# Patient Record
Sex: Male | Born: 1945 | Race: Black or African American | Hispanic: No | Marital: Married | Smoking: Former smoker
Health system: Southern US, Community
[De-identification: ages and names within clinical notes are randomized; demographics above are authoritative.]

## PROBLEM LIST (undated history)

## (undated) DIAGNOSIS — E039 Hypothyroidism, unspecified: Secondary | ICD-10-CM

## (undated) DIAGNOSIS — I10 Essential (primary) hypertension: Secondary | ICD-10-CM

## (undated) DIAGNOSIS — R42 Dizziness and giddiness: Secondary | ICD-10-CM

## (undated) HISTORY — PX: CHOLECYSTECTOMY: SHX55

---

## 2006-01-15 ENCOUNTER — Emergency Department: Payer: Self-pay | Admitting: Emergency Medicine

## 2006-01-17 ENCOUNTER — Ambulatory Visit: Payer: Self-pay | Admitting: Orthopedic Surgery

## 2006-01-26 ENCOUNTER — Emergency Department: Payer: Self-pay | Admitting: Unknown Physician Specialty

## 2006-01-31 ENCOUNTER — Ambulatory Visit: Payer: Self-pay | Admitting: Internal Medicine

## 2010-09-11 ENCOUNTER — Emergency Department: Payer: Self-pay | Admitting: Emergency Medicine

## 2011-11-15 ENCOUNTER — Emergency Department: Payer: Self-pay | Admitting: Emergency Medicine

## 2012-07-07 DIAGNOSIS — B351 Tinea unguium: Secondary | ICD-10-CM | POA: Diagnosis not present

## 2012-07-07 DIAGNOSIS — R300 Dysuria: Secondary | ICD-10-CM | POA: Diagnosis not present

## 2012-07-07 DIAGNOSIS — I10 Essential (primary) hypertension: Secondary | ICD-10-CM | POA: Diagnosis not present

## 2012-07-07 DIAGNOSIS — Z0000 Encounter for general adult medical examination without abnormal findings: Secondary | ICD-10-CM | POA: Diagnosis not present

## 2012-07-07 DIAGNOSIS — Z125 Encounter for screening for malignant neoplasm of prostate: Secondary | ICD-10-CM | POA: Diagnosis not present

## 2012-10-24 ENCOUNTER — Inpatient Hospital Stay: Payer: Self-pay | Admitting: Internal Medicine

## 2012-10-24 DIAGNOSIS — I6350 Cerebral infarction due to unspecified occlusion or stenosis of unspecified cerebral artery: Secondary | ICD-10-CM | POA: Diagnosis not present

## 2012-10-24 DIAGNOSIS — I10 Essential (primary) hypertension: Secondary | ICD-10-CM | POA: Diagnosis not present

## 2012-10-24 DIAGNOSIS — Z87891 Personal history of nicotine dependence: Secondary | ICD-10-CM | POA: Diagnosis not present

## 2012-10-24 DIAGNOSIS — R471 Dysarthria and anarthria: Secondary | ICD-10-CM | POA: Diagnosis present

## 2012-10-24 DIAGNOSIS — G459 Transient cerebral ischemic attack, unspecified: Secondary | ICD-10-CM | POA: Diagnosis not present

## 2012-10-24 DIAGNOSIS — R42 Dizziness and giddiness: Secondary | ICD-10-CM | POA: Diagnosis present

## 2012-10-24 DIAGNOSIS — H1030 Unspecified acute conjunctivitis, unspecified eye: Secondary | ICD-10-CM | POA: Diagnosis not present

## 2012-10-24 DIAGNOSIS — G510 Bell's palsy: Secondary | ICD-10-CM | POA: Diagnosis not present

## 2012-10-24 DIAGNOSIS — Z9089 Acquired absence of other organs: Secondary | ICD-10-CM | POA: Diagnosis not present

## 2012-10-24 DIAGNOSIS — R4789 Other speech disturbances: Secondary | ICD-10-CM | POA: Diagnosis present

## 2012-10-24 DIAGNOSIS — I498 Other specified cardiac arrhythmias: Secondary | ICD-10-CM | POA: Diagnosis not present

## 2012-10-24 DIAGNOSIS — R1310 Dysphagia, unspecified: Secondary | ICD-10-CM | POA: Diagnosis present

## 2012-10-24 DIAGNOSIS — D219 Benign neoplasm of connective and other soft tissue, unspecified: Secondary | ICD-10-CM | POA: Diagnosis not present

## 2012-10-24 DIAGNOSIS — R51 Headache: Secondary | ICD-10-CM | POA: Diagnosis not present

## 2012-10-24 LAB — COMPREHENSIVE METABOLIC PANEL
Albumin: 3.6 g/dL (ref 3.4–5.0)
BUN: 13 mg/dL (ref 7–18)
Creatinine: 0.99 mg/dL (ref 0.60–1.30)
EGFR (African American): >60
EGFR (Non-African Amer.): >60
Potassium: 3.9 mmol/L (ref 3.5–5.1)
SGOT(AST): 28 U/L (ref 15–37)
SGPT (ALT): 24 U/L (ref 12–78)
Sodium: 139 mmol/L (ref 136–145)
Total Protein: 7.8 g/dL (ref 6.4–8.2)

## 2012-10-24 LAB — CBC
MCV: 86 fL (ref 80–100)
RBC: 4.88 10*6/uL (ref 4.40–5.90)

## 2012-10-25 LAB — BASIC METABOLIC PANEL
Anion Gap: 4 — ABNORMAL LOW (ref 7–16)
BUN: 11 mg/dL (ref 7–18)
Calcium, Total: 8.8 mg/dL (ref 8.5–10.1)
Chloride: 107 mmol/L (ref 98–107)
Co2: 28 mmol/L (ref 21–32)
Creatinine: 0.92 mg/dL (ref 0.60–1.30)
EGFR (African American): >60
EGFR (Non-African Amer.): >60
Glucose: 125 mg/dL — ABNORMAL HIGH (ref 65–99)
Osmolality: 278 (ref 275–301)
Potassium: 3.9 mmol/L (ref 3.5–5.1)
Sodium: 139 mmol/L (ref 136–145)

## 2012-10-25 LAB — LIPID PANEL
Cholesterol: 129 mg/dL (ref 0–200)
HDL Cholesterol: 50 mg/dL (ref 40–60)
Ldl Cholesterol, Calc: 71 mg/dL (ref 0–100)
VLDL Cholesterol, Calc: 8 mg/dL (ref 5–40)

## 2012-10-25 LAB — CBC WITH DIFFERENTIAL/PLATELET
Basophil #: 0.1 10*3/uL (ref 0.0–0.1)
Eosinophil #: 0.1 10*3/uL (ref 0.0–0.7)
Eosinophil %: 4.4 %
HCT: 38.8 % — ABNORMAL LOW (ref 40.0–52.0)
HGB: 13.5 g/dL (ref 13.0–18.0)
Lymphocyte #: 1.2 10*3/uL (ref 1.0–3.6)
Lymphocyte %: 37.6 %
MCH: 29.4 pg (ref 26.0–34.0)
MCV: 84 fL (ref 80–100)
Monocyte #: 0.4 x10 3/mm (ref 0.2–1.0)
Neutrophil %: 43.4 %
Platelet: 233 10*3/uL (ref 150–440)
RBC: 4.6 10*6/uL (ref 4.40–5.90)
RDW: 14.1 % (ref 11.5–14.5)
WBC: 3.2 10*3/uL — ABNORMAL LOW (ref 3.8–10.6)

## 2012-10-25 LAB — URINALYSIS, COMPLETE
Bacteria: NONE SEEN
Bilirubin,UR: NEGATIVE
Blood: NEGATIVE
Glucose,UR: NEGATIVE mg/dL (ref 0–75)
Ketone: NEGATIVE
Leukocyte Esterase: NEGATIVE
Protein: NEGATIVE
RBC,UR: 1 /HPF (ref 0–5)
Specific Gravity: 1.02 (ref 1.003–1.030)
Squamous Epithelial: <1

## 2012-10-25 LAB — MAGNESIUM: Magnesium: 1.5 mg/dL — ABNORMAL LOW

## 2012-10-26 LAB — APTT: Activated PTT: 27.5 secs (ref 23.6–35.9)

## 2012-10-26 LAB — SEDIMENTATION RATE: Erythrocyte Sed Rate: 6 mm/hr (ref 0–20)

## 2012-10-27 LAB — MAGNESIUM: Magnesium: 1.6 mg/dL — ABNORMAL LOW

## 2012-11-02 DIAGNOSIS — H169 Unspecified keratitis: Secondary | ICD-10-CM | POA: Diagnosis not present

## 2012-11-19 DIAGNOSIS — R9409 Abnormal results of other function studies of central nervous system: Secondary | ICD-10-CM | POA: Diagnosis not present

## 2012-11-19 DIAGNOSIS — G510 Bell's palsy: Secondary | ICD-10-CM | POA: Diagnosis not present

## 2012-11-19 DIAGNOSIS — I10 Essential (primary) hypertension: Secondary | ICD-10-CM | POA: Diagnosis not present

## 2012-12-21 DIAGNOSIS — H40009 Preglaucoma, unspecified, unspecified eye: Secondary | ICD-10-CM | POA: Diagnosis not present

## 2013-08-16 DIAGNOSIS — I10 Essential (primary) hypertension: Secondary | ICD-10-CM | POA: Diagnosis not present

## 2013-08-16 DIAGNOSIS — G510 Bell's palsy: Secondary | ICD-10-CM | POA: Diagnosis not present

## 2013-08-16 DIAGNOSIS — E782 Mixed hyperlipidemia: Secondary | ICD-10-CM | POA: Diagnosis not present

## 2013-08-16 DIAGNOSIS — R300 Dysuria: Secondary | ICD-10-CM | POA: Diagnosis not present

## 2013-08-16 DIAGNOSIS — Z0000 Encounter for general adult medical examination without abnormal findings: Secondary | ICD-10-CM | POA: Diagnosis not present

## 2013-08-16 DIAGNOSIS — R3911 Hesitancy of micturition: Secondary | ICD-10-CM | POA: Diagnosis not present

## 2013-08-16 DIAGNOSIS — Z125 Encounter for screening for malignant neoplasm of prostate: Secondary | ICD-10-CM | POA: Diagnosis not present

## 2013-11-09 ENCOUNTER — Ambulatory Visit: Payer: Self-pay | Admitting: Gastroenterology

## 2013-11-09 DIAGNOSIS — E079 Disorder of thyroid, unspecified: Secondary | ICD-10-CM | POA: Diagnosis not present

## 2013-11-09 DIAGNOSIS — Z1211 Encounter for screening for malignant neoplasm of colon: Secondary | ICD-10-CM | POA: Diagnosis not present

## 2013-11-09 DIAGNOSIS — D123 Benign neoplasm of transverse colon: Secondary | ICD-10-CM | POA: Diagnosis not present

## 2013-11-09 DIAGNOSIS — I10 Essential (primary) hypertension: Secondary | ICD-10-CM | POA: Diagnosis not present

## 2013-11-09 DIAGNOSIS — Z79899 Other long term (current) drug therapy: Secondary | ICD-10-CM | POA: Diagnosis not present

## 2013-12-23 DIAGNOSIS — I10 Essential (primary) hypertension: Secondary | ICD-10-CM | POA: Diagnosis not present

## 2013-12-23 DIAGNOSIS — R7301 Impaired fasting glucose: Secondary | ICD-10-CM | POA: Diagnosis not present

## 2013-12-23 DIAGNOSIS — R5383 Other fatigue: Secondary | ICD-10-CM | POA: Diagnosis not present

## 2013-12-23 DIAGNOSIS — E039 Hypothyroidism, unspecified: Secondary | ICD-10-CM | POA: Diagnosis not present

## 2013-12-23 DIAGNOSIS — E782 Mixed hyperlipidemia: Secondary | ICD-10-CM | POA: Diagnosis not present

## 2014-01-11 DIAGNOSIS — E039 Hypothyroidism, unspecified: Secondary | ICD-10-CM | POA: Diagnosis not present

## 2014-01-11 DIAGNOSIS — R5383 Other fatigue: Secondary | ICD-10-CM | POA: Diagnosis not present

## 2014-01-13 DIAGNOSIS — R946 Abnormal results of thyroid function studies: Secondary | ICD-10-CM | POA: Diagnosis not present

## 2014-01-27 DIAGNOSIS — I10 Essential (primary) hypertension: Secondary | ICD-10-CM | POA: Diagnosis not present

## 2014-01-27 DIAGNOSIS — E039 Hypothyroidism, unspecified: Secondary | ICD-10-CM | POA: Diagnosis not present

## 2014-01-27 DIAGNOSIS — E042 Nontoxic multinodular goiter: Secondary | ICD-10-CM | POA: Diagnosis not present

## 2014-02-24 ENCOUNTER — Ambulatory Visit: Payer: Self-pay | Admitting: Physician Assistant

## 2014-02-24 DIAGNOSIS — E041 Nontoxic single thyroid nodule: Secondary | ICD-10-CM | POA: Diagnosis not present

## 2014-02-25 DIAGNOSIS — E041 Nontoxic single thyroid nodule: Secondary | ICD-10-CM | POA: Diagnosis not present

## 2014-02-25 DIAGNOSIS — E042 Nontoxic multinodular goiter: Secondary | ICD-10-CM | POA: Diagnosis not present

## 2014-04-13 DIAGNOSIS — R946 Abnormal results of thyroid function studies: Secondary | ICD-10-CM | POA: Diagnosis not present

## 2014-04-13 DIAGNOSIS — E042 Nontoxic multinodular goiter: Secondary | ICD-10-CM | POA: Diagnosis not present

## 2014-04-13 DIAGNOSIS — I10 Essential (primary) hypertension: Secondary | ICD-10-CM | POA: Diagnosis not present

## 2014-04-29 NOTE — Discharge Summary (Signed)
PATIENT NAME:  REEDY, BIERNAT MR#:  [id number] DATE OF BIRTH:  06/24/1945  DATE OF ADMISSION:  10/24/2012 DATE OF DISCHARGE:  10/27/2012  DISCHARGE DIAGNOSIS:  1.  Left facial weakness, Bell's palsy, cerebrovascular accident ruled out. 2.  Hypertension on presentation, which is resolved. 3.  Vertigo on admission, which is resolved.   CONDITION ON DISCHARGE: Stable.   CODE STATUS: Full code.   MEDICATIONS ON DISCHARGE:  1.  Acetaminophen, butalbital and caffeine every six hours for three days as needed.  2.  Valacyclovir 1 gram oral every 12 hours for nine days.  3.  Prednisone 10 mg oral tablet, start at 60 and taper x 10 mg until complete.  4.  Ciprofloxacin ophthalmic solution two drops to each eye every day for four hours.   DIET ON DISCHARGE: Low sodium diet; consistency regular.  ACTIVTY: As  tolerated.  TIMEFRAME TO FOLLOW-UP:  In 1 to 2 weeks in [facility] neurology clinic.  HISTORY OF PRESENT ILLNESS:  See history and physical done on 10/18 by Dr. Nicholes Mango.  This 69 year old African American male has hypertension, presented to ER with chief complaint of headache and left-sided facial droop, slurred speech, difficulty swallowing since yesterday. He was totally unable shut his left eye, and it was constantly watering from that eye.  Speech was slurry and having difficulty in expressing himself. He denied any blurry vision, dizziness or loss of consciousness, and no similar episode in the past.   HOSPITAL COURSE AND STAY: For left-sided facial weakness we did workup to rule out cerebrovascular accident.  We did MRI, which was negative for any blockages. Carotid Doppler was also negative, without any blockages. His headache continued up after ruling out cerebrovascular accident, so we called a neurologist to come and see him. He thought most likely it is Bell's palsy, due to weakness on one side of the face and severe headache, and he suggested to start on steroid and keep him for watching  one more day, but the next day morning he requested that he wanted to go home. As he was hemodynamically stable, we decided to discharge him.   Dr. Tamala Julian, the neurologist, wanted to do further work-up about his Bell's palsy, including lumbar puncture also, but the patient did not want to have any further work-up, had already started feeling better. Dr. Thompson Caul plan was do lumbar puncture, but the patient denies for that, and he chose to go home, as symptoms were improving.   Dysphagia. He passed swallowing evaluation and advised diet on discharge.  Hypertension. Blood pressure was high initially on presentation, but that might be due to headache. It was then within normal limits.   CONSULT IN THE HOSPITAL: Dr. Valora Corporal for neurology.  IMPORTANT LABORATORY DATA:  WBC 4.4, hemoglobin 14.3. Creatinine 0.99, sodium 139, potassium 3.9. Urinalysis was grossly negative. Echocardiogram of the heart showed left ventricular ejection fraction 70%, elevated left atrial and left ventricular end diastolic pressure, restrictive pattern of LV diastolic filling. MRI of the brain without contrast showed no acute intracranial pathology.  TOTAL TIME SPENT ON THIS DISCHARGE: 40 minutes.   ____________________________ Ceasar Lund Anselm Jungling, MD vgv:cg D: 10/31/2012 23:26:00 ET T: 11/01/2012 00:59:09 ET JOB#: [id number]  cc: Doneta Public. Melrose Nakayama, MD Ceasar Lund Anselm Jungling, MD, <Dictator> DR. JACK WATERS, __________ CLINIC, NEUROLOGY OFFICE   Vaughan Basta MD ELECTRONICALLY SIGNED 11/04/2012 13:58

## 2014-04-29 NOTE — Consult Note (Signed)
Referring Physician:  Nicholes Price :   Primary Care Physician:  Jeremiah Price : [place], [address] [place], [place] [address], [place] [phone]  Reason for Consult: Admit Date: 24-Oct-2012  Chief Complaint: L facial droop  Reason for Consult: L facial droop   History of Present Illness: History of Present Illness:   69 yo RHD M presents to [facility] secondary to new onset L facial droop and slurred speech.  He denies any other complaints besides a mild headache which on the top of his head and new for him.  [name], [name], no illicits, retired Scientist, [name], resting  comfortable, [name], sclera nonicteric, oropharynx clear  Neck: supple, [name], [name], no wheezing, good movement  Cardiac: RRR, no murmurs, no edema, [name], [name], normal language, follows complex commands, [name], [name], [name], [name], tongue midline, [name], tone, [name], [name], [name], temperature, [name], [name], [name], [name], Serum 7.8  Albumin, [name], Serum 129  Triglycerides, Serum 38  HDL (INHOUSE) 50  VLDL Cholesterol Calculated 8  LDL Cholesterol Calculated 71 (Result(s) reported on 25 Oct 2012 at 07:34AM.)  Glucose, Serum  125  BUN 11  Creatinine (comp) 0.92  Sodium, Serum 139  Potassium, Serum 3.9  Chloride, [name], Serum 28  Calcium (Total), [name], pregnant women, [name], Serum  1.5 (1.8-2.4 THERAPEUTIC RANGE: 4-7 mg/dL TOXIC: > 10 mg/dL   -----------------------)  Cardiac:  18-Oct-14 22:00   Troponin I < 0.02 (0.00-0.05 0.05 ng/mL or less: NEGATIVE  Repeat testing in 3-6 hrs  if clinically indicated. >0.05 ng/mL: POTENTIAL  MYOCARDIAL INJURY. Repeat  testing in 3-6 hrs if  clinically indicated. NOTE: An increase or decrease  of 30% or more on serial  testing suggests a  clinically important change)  Routine UA:  19-Oct-14 01:03   Color (UA) Yellow  Clarity (UA) Clear  Glucose (UA) Negative  Bilirubin (UA) Negative  Ketones (UA) Negative  Specific Gravity (UA) 1.020  Blood (UA) Negative  pH (UA) 6.0  Protein (UA) Negative  Nitrite (UA) Negative  Leukocyte Esterase (UA) Negative (Result(s) reported on 25 Oct 2012 at 01:36AM.)  RBC (UA) 1 /HPF  WBC (UA) <1 /HPF  Bacteria (UA) NONE SEEN  Epithelial Cells (UA) <1 /HPF  Mucous (UA) PRESENT (Result(s) reported on 25 Oct 2012 at 01:36AM.)  Routine Hem:  19-Oct-14 06:36   WBC (CBC)  3.2  RBC (CBC) 4.60  Hemoglobin (CBC) 13.5  Hematocrit (CBC)  38.8  Platelet Count (CBC) 233  MCV 84  MCH 29.4  MCHC 34.8  RDW 14.1  Neutrophil % 43.4  Lymphocyte % 37.6  Monocyte % 12.5  Eosinophil % 4.4  Basophil % 2.1  Neutrophil # 1.4  Lymphocyte # 1.2  Monocyte # 0.4  Eosinophil # 0.1  Basophil # 0.1 (Result(s) reported on 25 Oct 2012 at 07:06AM.)   Radiology Results: [place]    19-Oct-14 15:42, [place] Doppler Bilateral  [place] Doppler Bilateral   REASON FOR EXAM:    CVA  COMMENTS:       PROCEDURE: [place] - [place]OT[place] DOPPLER BILATERAL  - Oct 25 2012  3:42PM     RESULT: Carotid Doppler interrogation demonstrates no significant   atherosclerotic plaque or stenosis. No ulceration is seen. The color  Doppler and spectral Doppler appearance is normal throughout the carotids   segments evaluated. The vertebral arteries appear to be unremarkable.   There is antegrade flow in both vertebrals without flow reversal. The   Doppler waveforms are unremarkable. The peak  systolic velocities are   within normal limits bilaterally. The internal to common carotid peak   systolic velocity ratio of is 0.35 on the right and 0.68 on the left.    IMPRESSION:   1. No significant atherosclerotic disease. No evidence of hemodynamically   significant stenosis.    Dictation Site: 6        Verified By: GSundra Aland M.D., MD  MRI:    20-Oct-14 09:50, MRI Brain Without Contrast  MRI Brain Without Contrast   REASON FOR EXAM:    CVA  COMMENTS:       PROCEDURE: MR  - MR BRAIN WO CONTRAST  - Oct 26 2012  9:50AM     RESULT: History: CVA    Technique: Multiplanar, multisequence MRI of the brain was obtained   without IV contrast.  Comparison:  None    Findings:     There is no acute infarct. There is no hemorrhage. There is no pathologic     extra-axial fluid collection. There is generalized cerebral atrophy.   There is no hydrocephalus. The ventricles are normal for age. The basal   cisterns are patent. There are no abnormal extra-axial fluid collections.     The visualized paranasal sinuses and mastoid sinuses are clear. The skull   base and calvarium demonstrate normal signal. The major intracranial flow   voids, including dural venous sinuses appear patent.    IMPRESSION:     No acute intracranial pathology.    Dictation Site: 1      Verified By: Jennette Banker, M.D., MD  CT:    18-Oct-14 22:18, CT Head Without Contrast  CT Head Without Contrast   REASON FOR EXAM:    CVA  COMMENTS:   May transport without cardiac monitor    PROCEDURE: CT  - CT HEAD WITHOUT CONTRAST  - Oct 24 2012 10:18PM     RESULT: Comparison is made images dated 09/11/2010. There is prominence of   the ventricles and sulci within normal limits for the patient's age.   There is no evolving infarct , hemorrhage, mass or mass effect. The   paranasal sinuses and mastoid air cells demonstrate normal appearing   aeration. The calvarium is intact.    IMPRESSION:   Atrophy. No acute intracranial abnormality. No interval   change.    Dictation Site: 6    Verified By: Sundra Aland, M.D., MD   Radiology Impression: Radiology Impression: MRI of brain personally reviewed by me and completely normal   Impression/Recommendations: Recommendations:   prior notes reviewed by me reviewed by me    Probable Bells palsy-  there are a few things on the differential to r/o since he has a headache which include autoimmune and infectious cranial nerve palsies.  If it is Bells palsy, pt will still likely have some deficits do the severity of the attack. Headache-  unknown etiology and quite unusual,  needs more work up MRI of brain w/ contrast and IAC's LP tomorrow after coagulation returns check lyme, B12, ESR, CRP, ANA start Valtrex 1gm BID now will start Prednisone tomorrow if all w/u is neg will follow  Electronic Signatures: Jeremiah Price (MD)  (Signed 20-Oct-14 14:50)  Authored: REFERRING PHYSICIAN, Primary Care Physician, Consult, History of Present Illness, Review of Systems, PAST MEDICAL/SURGICAL HISTORY, HOME MEDICATIONS, ALLERGIES, Social/Family History, NURSING VITAL SIGNS, Physical Exam-, LAB RESULTS, RADIOLOGY RESULTS, Recommendations   Last Updated: 20-Oct-14 14:50 by Jeremiah Price (MD)

## 2014-04-29 NOTE — H&P (Signed)
PATIENT NAME:  RUMI, TARAS MR#:  [id number] DATE OF BIRTH:  1945-10-29  DATE OF ADMISSION:  10/24/2012  PRIMARY CARE PHYSICIAN: Dr. Clayborn Bigness.   REFERRING PHYSICIAN: Dr. Lenise Arena.   CHIEF COMPLAINT: Left facial droop, difficulty swallowing, slurry speech.   HISTORY OF PRESENT ILLNESS: The patient is a 69 year old African American male with past medical history of hypertension. He is presenting to the ER with a chief complaint of headache, left facial droop, slurry speech, difficulty with swallowing since yesterday. The patient was in his usual state of health until yesterday afternoon. He first started having a dull headache associated with left facial droop. The patient was also unable to shut his left eye since this morning. His speech is slurry, and having difficulty in expressing himself. Wife is reporting that he was giving a lot of pauses while speaking and searching for words. The patient is having difficulty with swallowing. Left leg has decreased touch sensation. His mouth is pushed to the right side. No similar complaints in the past. Denies any blurry vision, dizziness or loss of consciousness. Denies any chest pain or shortness of breath. In the ER, CAT scan of the head is negative. Hospitalist team is called to admit the patient. The patient has received aspirin, rectal suppository as he is having difficulty with swallowing.   PAST MEDICAL HISTORY: Hypertension. History of vertigo in the past.  PAST SURGICAL HISTORY: Cholecystectomy.  ALLERGIES: THE PATIENT HAS NO KNOWN DRUG ALLERGIES.   PSYCHOSOCIAL HISTORY: Lives at home with wife. He used to smoke, but quit smoking in the year 1973. He used to drink, and quit drinking one year ago. Denies any illicit drug usage.   FAMILY HISTORY: Mom was healthy. Dad deceased when he was very young.  HOME MEDICATIONS: Norco 1 tablet p.o. q.6h. as needed, hydrochlorothiazide 25 mg once daily.   REVIEW OF SYSTEMS:  CONSTITUTIONAL:  Denies any fever, fatigue. EYES: Denies blurry vision and cataracts.  ENT: Denies epistaxis, discharge.  RESPIRATION: Denies cough, COPD. CARDIOVASCULAR: Denies chest pain or palpitations.  GASTROINTESTINAL: Denies nausea, vomiting, diarrhea, abdominal pain.  GENITOURINARY: No dysuria, hematuria. No prostatitis. Denies hernia.  ENDOCRINE: Denies polyuria, nocturia, thyroid problems.  HEMATOLOGIC AND LYMPHATIC: No anemia, easy bruising, bleeding.  INTEGUMENTARY: No acne, rash, lesions.  MUSCULOSKELETAL: No joint pain in the neck and back. Denies gout.  NEUROLOGIC: Complaining of dysarthria, headache, decreased sensation in the left leg. PSYCHIATRIC:  Denies any ADD, OCD.   PHYSICAL EXAMINATION:  VITAL SIGNS: Temperature 98.2, pulse 60, respirations 16, blood pressure 140/62, pulse oximetry 98%.  GENERAL APPEARANCE: Not in acute distress. Moderately built and nourished.  HEENT: Normocephalic, atraumatic. Pupils are equal, reacting light and accommodation. No scleral icterus. No conjunctival injection. Extraocular movements are intact, but the patient is unable to shut his left eye. Angle of mouth is pushed to the right side. Nasolabial fold is obliterated on the left side. Left frontal folds are obliterated.  Drooling of saliva is noted on the left side of the angle of the mouth.  NECK: Supple. [name]. No thyromegaly. Range of motion is intact.  LUNGS: Clear to auscultation bilaterally. No accessory muscle use and no anterior chest wall tenderness on palpation. No peripheral edema.  CARDIAC: S1, S2 normal. Regular rate and rhythm. [name].  GASTROINTESTINAL: Soft. Bowel sounds are positive in all four quadrants. Nontender, nondistended. No hepatosplenomegaly. No masses felt.  NEUROLOGIC:  Awake, alert, oriented x3. Cranial nerves II through XII are grossly intact. No cerebellar signs. Finger-nose test  is intact. No pronator drift. Decreased touch sensation in the left lower extremity. Motor is  intact in all four extremities with 5/5 strength. Positive dysphagia, positive dysarthria.  EXTREMITIES: [name]. No cyanosis. No clubbing. Dorsalis pedis and posterior tibialis pulses are intact. No CVA tenderness.  SKIN: Warm to touch. Normal turgor. No rashes. No lesions.  MUSCULOSKELETAL: No joint effusion, tenderness, erythema.  PSYCHIATRIC: Normal mood and affect.   DIAGNOSTIC STUDIES:  CAT scan of the head: No acute findings. LFTs are normal. Troponin less than 0.02. CBC normal. BMP is also normal, except anion gap is low at 3 and  glucose is at 102. A 12-lead EKG: Sinus bradycardia at 57 beats per minute, nonspecific ST-T wave changes.    ASSESSMENT AND PLAN: A 69 year old African American male, presenting to the ER with chief complaint of left facial droop, dysphagia, dysarthria, decreased touch sensation in the left lower extremity. Will be admitted with following assessment and plan.   1.  Acute cerebrovascular accident with dysphagia, dysarthria, and other stroke symptoms. Will get stroke work-up, as the patient is persistently having stroke symptoms. We will obtain MRA of the brain, carotid Dopplers and 2-D echocardiogram. Will obtain neuro checks. Will keep him n.p.o. and perform swallowing evaluation. Will provide him IV fluids while the patient is n.p.o.  2.  Hypertension.  Will allow permissive hypertension, as the patient has acute cerebrovascular accident symptoms.  3.  Sinus bradycardia. The patient is asymptomatic.  Will continue close monitoring of the patient. Will be on telemetry.  4.  History of vertigo. The patient denies any vertigo at this time.  5.  Will provide him gastrointestinal and deep vein thrombosis prophylaxis.   He is full code. Wife is the medical power of attorney. Diagnosis and plan of care were discussed in detail with the patient and his wife at bedside. They both verbalized understanding of the plan.   Total time spent is 45 minutes.    ____________________________ Nicholes Mango, MD ag:cg D: 10/25/2012 01:09:17 ET T: 10/25/2012 01:36:41 ET JOB#: [id number]  cc: Nicholes Mango, MD, <Dictator> Nicholes Mango MD ELECTRONICALLY SIGNED 11/06/2012 1:59

## 2014-05-02 LAB — SURGICAL PATHOLOGY

## 2014-08-18 DIAGNOSIS — E042 Nontoxic multinodular goiter: Secondary | ICD-10-CM | POA: Diagnosis not present

## 2014-09-20 DIAGNOSIS — Z0000 Encounter for general adult medical examination without abnormal findings: Secondary | ICD-10-CM | POA: Diagnosis not present

## 2014-09-20 DIAGNOSIS — Z125 Encounter for screening for malignant neoplasm of prostate: Secondary | ICD-10-CM | POA: Diagnosis not present

## 2014-09-20 DIAGNOSIS — Z0001 Encounter for general adult medical examination with abnormal findings: Secondary | ICD-10-CM | POA: Diagnosis not present

## 2014-09-20 DIAGNOSIS — E782 Mixed hyperlipidemia: Secondary | ICD-10-CM | POA: Diagnosis not present

## 2014-09-20 DIAGNOSIS — K4090 Unilateral inguinal hernia, without obstruction or gangrene, not specified as recurrent: Secondary | ICD-10-CM | POA: Diagnosis not present

## 2014-09-20 DIAGNOSIS — E042 Nontoxic multinodular goiter: Secondary | ICD-10-CM | POA: Diagnosis not present

## 2014-09-20 DIAGNOSIS — I10 Essential (primary) hypertension: Secondary | ICD-10-CM | POA: Diagnosis not present

## 2014-12-14 DIAGNOSIS — R42 Dizziness and giddiness: Secondary | ICD-10-CM | POA: Diagnosis not present

## 2015-02-09 DIAGNOSIS — Z0001 Encounter for general adult medical examination with abnormal findings: Secondary | ICD-10-CM | POA: Diagnosis not present

## 2015-02-09 DIAGNOSIS — I10 Essential (primary) hypertension: Secondary | ICD-10-CM | POA: Diagnosis not present

## 2015-02-09 DIAGNOSIS — R7301 Impaired fasting glucose: Secondary | ICD-10-CM | POA: Diagnosis not present

## 2015-02-09 DIAGNOSIS — E782 Mixed hyperlipidemia: Secondary | ICD-10-CM | POA: Diagnosis not present

## 2015-02-09 DIAGNOSIS — R042 Hemoptysis: Secondary | ICD-10-CM | POA: Diagnosis not present

## 2015-04-07 DIAGNOSIS — R7301 Impaired fasting glucose: Secondary | ICD-10-CM | POA: Diagnosis not present

## 2015-04-07 DIAGNOSIS — E042 Nontoxic multinodular goiter: Secondary | ICD-10-CM | POA: Diagnosis not present

## 2015-04-07 DIAGNOSIS — K4090 Unilateral inguinal hernia, without obstruction or gangrene, not specified as recurrent: Secondary | ICD-10-CM | POA: Diagnosis not present

## 2015-04-07 DIAGNOSIS — I10 Essential (primary) hypertension: Secondary | ICD-10-CM | POA: Diagnosis not present

## 2015-09-22 DIAGNOSIS — K4090 Unilateral inguinal hernia, without obstruction or gangrene, not specified as recurrent: Secondary | ICD-10-CM | POA: Diagnosis not present

## 2015-09-22 DIAGNOSIS — Z0001 Encounter for general adult medical examination with abnormal findings: Secondary | ICD-10-CM | POA: Diagnosis not present

## 2015-09-22 DIAGNOSIS — E042 Nontoxic multinodular goiter: Secondary | ICD-10-CM | POA: Diagnosis not present

## 2015-09-22 DIAGNOSIS — I10 Essential (primary) hypertension: Secondary | ICD-10-CM | POA: Diagnosis not present

## 2015-09-22 DIAGNOSIS — R7301 Impaired fasting glucose: Secondary | ICD-10-CM | POA: Diagnosis not present

## 2015-09-22 DIAGNOSIS — Z125 Encounter for screening for malignant neoplasm of prostate: Secondary | ICD-10-CM | POA: Diagnosis not present

## 2016-02-16 ENCOUNTER — Emergency Department: Payer: Medicare Other

## 2016-02-16 ENCOUNTER — Inpatient Hospital Stay
Admission: EM | Admit: 2016-02-16 | Discharge: 2016-02-19 | DRG: 871 | Disposition: A | Discharge status: Home / Self Care | Payer: Medicare Other | Attending: Internal Medicine | Admitting: Internal Medicine

## 2016-02-16 ENCOUNTER — Encounter: Payer: Self-pay | Admitting: Emergency Medicine

## 2016-02-16 DIAGNOSIS — E039 Hypothyroidism, unspecified: Secondary | ICD-10-CM | POA: Diagnosis not present

## 2016-02-16 DIAGNOSIS — R7989 Other specified abnormal findings of blood chemistry: Secondary | ICD-10-CM

## 2016-02-16 DIAGNOSIS — A4151 Sepsis due to Escherichia coli [E. coli]: Secondary | ICD-10-CM | POA: Diagnosis not present

## 2016-02-16 DIAGNOSIS — R509 Fever, unspecified: Secondary | ICD-10-CM

## 2016-02-16 DIAGNOSIS — R945 Abnormal results of liver function studies: Secondary | ICD-10-CM

## 2016-02-16 DIAGNOSIS — K759 Inflammatory liver disease, unspecified: Secondary | ICD-10-CM

## 2016-02-16 DIAGNOSIS — R109 Unspecified abdominal pain: Secondary | ICD-10-CM | POA: Diagnosis not present

## 2016-02-16 DIAGNOSIS — R740 Nonspecific elevation of levels of transaminase and lactic acid dehydrogenase [LDH]: Secondary | ICD-10-CM

## 2016-02-16 DIAGNOSIS — K7200 Acute and subacute hepatic failure without coma: Secondary | ICD-10-CM | POA: Diagnosis present

## 2016-02-16 DIAGNOSIS — R7401 Elevation of levels of liver transaminase levels: Secondary | ICD-10-CM | POA: Diagnosis present

## 2016-02-16 DIAGNOSIS — R9431 Abnormal electrocardiogram [ECG] [EKG]: Secondary | ICD-10-CM | POA: Diagnosis not present

## 2016-02-16 DIAGNOSIS — I10 Essential (primary) hypertension: Secondary | ICD-10-CM | POA: Diagnosis present

## 2016-02-16 DIAGNOSIS — B199 Unspecified viral hepatitis without hepatic coma: Secondary | ICD-10-CM | POA: Diagnosis not present

## 2016-02-16 DIAGNOSIS — R42 Dizziness and giddiness: Secondary | ICD-10-CM | POA: Diagnosis not present

## 2016-02-16 DIAGNOSIS — A419 Sepsis, unspecified organism: Secondary | ICD-10-CM | POA: Diagnosis present

## 2016-02-16 HISTORY — DX: Essential (primary) hypertension: I10

## 2016-02-16 LAB — PROTIME-INR
INR: 1.03
PROTHROMBIN TIME: 13.5 s (ref 11.4–15.2)

## 2016-02-16 LAB — LACTIC ACID, PLASMA
LACTIC ACID, VENOUS: 1.4 mmol/L (ref 0.5–1.9)
LACTIC ACID, VENOUS: 1.3 mmol/L (ref 0.5–1.9)

## 2016-02-16 LAB — COMPREHENSIVE METABOLIC PANEL
ALT: 566 U/L — ABNORMAL HIGH (ref 17–63)
ANION GAP: 8 (ref 5–15)
AST: 911 U/L — ABNORMAL HIGH (ref 15–41)
Albumin: 4.1 g/dL (ref 3.5–5.0)
Alkaline Phosphatase: 237 U/L — ABNORMAL HIGH (ref 38–126)
BUN: 10 mg/dL (ref 6–20)
CHLORIDE: 100 mmol/L — AB (ref 101–111)
CO2: 28 mmol/L (ref 22–32)
Calcium: 9.1 mg/dL (ref 8.9–10.3)
Creatinine, Ser: 0.99 mg/dL (ref 0.61–1.24)
Glucose, Bld: 166 mg/dL — ABNORMAL HIGH (ref 65–99)
Potassium: 3.5 mmol/L (ref 3.5–5.1)
Sodium: 136 mmol/L (ref 135–145)
TOTAL PROTEIN: 8.1 g/dL (ref 6.5–8.1)
Total Bilirubin: 2.2 mg/dL — ABNORMAL HIGH (ref 0.3–1.2)

## 2016-02-16 LAB — LIPASE, BLOOD: Lipase: 61 U/L — ABNORMAL HIGH (ref 11–51)

## 2016-02-16 LAB — URINALYSIS, COMPLETE (UACMP) WITH MICROSCOPIC
Bacteria, UA: NONE SEEN
Bilirubin Urine: NEGATIVE
GLUCOSE, UA: NEGATIVE mg/dL
KETONES UR: NEGATIVE mg/dL
LEUKOCYTES UA: NEGATIVE
Nitrite: NEGATIVE
PH: 7 (ref 5.0–8.0)
Protein, ur: NEGATIVE mg/dL
SPECIFIC GRAVITY, URINE: 1.019 (ref 1.005–1.030)

## 2016-02-16 LAB — INFLUENZA PANEL BY PCR (TYPE A & B)
INFLBPCR: NEGATIVE
Influenza A By PCR: NEGATIVE

## 2016-02-16 LAB — CBC WITH DIFFERENTIAL/PLATELET
BASOS ABS: 0 10*3/uL (ref 0–0.1)
BASOS PCT: 0 %
EOS ABS: 0 10*3/uL (ref 0–0.7)
Eosinophils Relative: 0 %
HEMATOCRIT: 42.3 % (ref 40.0–52.0)
HEMOGLOBIN: 14.6 g/dL (ref 13.0–18.0)
Lymphocytes Relative: 6 %
Lymphs Abs: 0.5 10*3/uL — ABNORMAL LOW (ref 1.0–3.6)
MCH: 28.8 pg (ref 26.0–34.0)
MCHC: 34.4 g/dL (ref 32.0–36.0)
MCV: 83.8 fL (ref 80.0–100.0)
Monocytes Absolute: 0.1 10*3/uL — ABNORMAL LOW (ref 0.2–1.0)
Monocytes Relative: 2 %
NEUTROS ABS: 7.4 10*3/uL — AB (ref 1.4–6.5)
NEUTROS PCT: 92 %
Platelets: 258 10*3/uL (ref 150–440)
RBC: 5.05 MIL/uL (ref 4.40–5.90)
RDW: 14.1 % (ref 11.5–14.5)
WBC: 8 10*3/uL (ref 3.8–10.6)

## 2016-02-16 LAB — TSH: TSH: 0.367 u[IU]/mL (ref 0.350–4.500)

## 2016-02-16 MED ORDER — IOPAMIDOL (ISOVUE-300) INJECTION 61%
100.0000 mL | Freq: Once | INTRAVENOUS | Status: AC | PRN
  Administered 2016-02-16: 100 mL via INTRAVENOUS

## 2016-02-16 MED ORDER — ONDANSETRON HCL 4 MG/2ML IJ SOLN
4.0000 mg | Freq: Once | INTRAMUSCULAR | Status: AC
  Administered 2016-02-16: 4 mg via INTRAVENOUS
  Filled 2016-02-16: qty 2

## 2016-02-16 MED ORDER — SODIUM CHLORIDE 0.9 % IV BOLUS (SEPSIS)
1000.0000 mL | Freq: Once | INTRAVENOUS | Status: AC
  Administered 2016-02-16: 1000 mL via INTRAVENOUS

## 2016-02-16 MED ORDER — IOPAMIDOL (ISOVUE-300) INJECTION 61%: 30.0000 mL | Freq: Once | INTRAVENOUS | Status: DC | PRN

## 2016-02-16 MED ORDER — PIPERACILLIN-TAZOBACTAM 3.375 G IVPB 30 MIN
3.3750 g | Freq: Once | INTRAVENOUS | Status: AC
  Administered 2016-02-16: 3.375 g via INTRAVENOUS
  Filled 2016-02-16: qty 50

## 2016-02-16 MED ORDER — ACETAMINOPHEN 500 MG PO TABS
1000.0000 mg | ORAL_TABLET | Freq: Once | ORAL | Status: AC
  Administered 2016-02-16: 1000 mg via ORAL
  Filled 2016-02-16: qty 2

## 2016-02-16 NOTE — ED Triage Notes (Addendum)
Pt to triage via [place], reports dizziness, abd pain, and chills since yesterday.  Pt reports nausea as well.  Temp in triage 103, tachy 105.    Per first nurse, pt had large emesis while in waiting room

## 2016-02-16 NOTE — ED Notes (Signed)
Called lab to inform this RN forgot to print second label for Blood culture bottle, Murray Hodgkins from lab reports she will be able to print labels in lab.

## 2016-02-16 NOTE — ED Provider Notes (Addendum)
[facility] Emergency Department Provider Note  ____________________________________________   I have reviewed the triage vital signs and the nursing notes.   HISTORY  Chief Complaint Dizziness; Abdominal Pain; and Chills    HPI Jeremiah Price is a 71 y.o. male who presents today, during the height of the flu season, with fever, started this morning. Also feels lightheaded. Patient has a history of hypertension. No new medications. He had 1 episode of vomiting and some loose stool. He states that to care of the abdominal pain. He was having a diffuse abdominal pain which is now gone. It was relieved when he threw up. He denies any ongoing alcohol abuse. He denies any focal numbness or weakness or closed head injury. He denies sore throat. He has had a slight cough. Patient is somewhat limited historian. He has a history of vertigo he states in the past. He states he felt lightheaded like it might be ongoing vertigo. All this started this morning. He was not aware he had a fever until he got here today. Denies dysuria or urinary frequency. Denies melena or bright red blood per rectum.      Past Medical History:  Diagnosis Date  . Hypertension   . Thyroid disease     There are no active problems to display for this patient.   History reviewed. No pertinent surgical history.  Prior to Admission medications   Not on File    Allergies Patient has no known allergies.  History reviewed. No pertinent family history.  Social History Social History  Substance Use Topics  . Smoking status: Never Smoker  . Smokeless tobacco: Never Used  . Alcohol use No    Review of Systems Constitutional: No fever/chills Eyes: No visual changes. ENT: No sore throat. No stiff neck no neck pain Cardiovascular: Denies chest pain. Respiratory: Denies shortness of breath. Gastrointestinal:  The history of present illness. Genitourinary: Negative for  dysuria. Musculoskeletal: Negative lower extremity swelling Skin: Negative for rash. Neurological: Negative for severe headaches, focal weakness or numbness. 10-point ROS otherwise negative.  ____________________________________________   PHYSICAL EXAM:  VITAL SIGNS: ED Triage Vitals [02/16/16 2014]  Enc Vitals Group     BP (!) 184/97     Pulse Rate (!) 105     Resp 20     Temp (!) 103 F (39.4 C)     Temp Source Oral     SpO2 99 %     Weight 226 lb (102.5 kg)     Height 6\' 3"  (1.905 m)     Head Circumference      Peak Flow      Pain Score 2     Pain Loc      Pain Edu?      Excl. in [place]?     Constitutional: Alert and oriented. Appears as if he doesn't feel well but nontoxic. Eyes: Conjunctivae are normal. PERRL. [name]. Head: Atraumatic. Nose: No congestion/rhinnorhea. Mouth/Throat: Mucous membranes are moist.  Oropharynx non-erythematous. Neck: No stridor.   Nontender with no meningismus Cardiovascular: Tachycardia noted. Grossly normal heart sounds.  Good peripheral circulation. Respiratory: Normal respiratory effort.  No retractions. Lungs CTAB. Abdominal: Soft and nontender. No distention. No guarding no rebound deep palpation which is no evidence of discomfort. Back:  There is no focal tenderness or step off.  there is no midline tenderness there are no lesions noted. there is no CVA tenderness Musculoskeletal: No lower extremity tenderness, no upper extremity tenderness. No joint effusions, no DVT signs  strong distal pulses no edema Neurologic:  Normal speech and language. No gross focal neurologic deficits are appreciated.  Skin:  Skin is warm, dry and intact. No rash noted. Psychiatric: Mood and affect are normal. Speech and behavior are normal.  ____________________________________________   LABS (all labs ordered are listed, but only abnormal results are displayed)  Labs Reviewed  COMPREHENSIVE METABOLIC PANEL - Abnormal; Notable for the following:        Result Value   Chloride 100 (*)    Glucose, Bld 166 (*)    AST 911 (*)    ALT 566 (*)    Alkaline Phosphatase 237 (*)    Total Bilirubin 2.2 (*)    All other components within normal limits  CBC WITH DIFFERENTIAL/PLATELET - Abnormal; Notable for the following:    Neutro Abs 7.4 (*)    Lymphs Abs 0.5 (*)    Monocytes Absolute 0.1 (*)    All other components within normal limits  CULTURE, BLOOD (ROUTINE X 2)  CULTURE, BLOOD (ROUTINE X 2)  URINE CULTURE  LACTIC ACID, PLASMA  PROTIME-INR  INFLUENZA PANEL BY PCR (TYPE A & B)  LACTIC ACID, PLASMA  URINALYSIS, COMPLETE (UACMP) WITH MICROSCOPIC   ____________________________________________  EKG  I personally interpreted any EKGs ordered by me or triage Normal sinus tachycardia rate 106, RAD noted, borderline, LVH noted, no acute ischemia ____________________________________________  RADIOLOGY  I reviewed any imaging ordered by me or triage that were performed during my shift and, if possible, patient and/or family made aware of any abnormal findings. ____________________________________________   PROCEDURES  Procedure(s) performed: None  Procedures  Critical Care performed: CRITICAL CARE Performed by: Schuyler Amor   Total critical care time: 44 minutes  Critical care time was exclusive of separately billable procedures and treating other patients.  Critical care was necessary to treat or prevent imminent or life-threatening deterioration.  Critical care was time spent personally by me on the following activities: development of treatment plan with patient and/or surrogate as well as nursing, discussions with consultants, evaluation of patient's response to treatment, examination of patient, obtaining history from patient or surrogate, ordering and performing treatments and interventions, ordering and review of laboratory studies, ordering and review of radiographic studies, pulse oximetry and re-evaluation of patient's  condition.   ____________________________________________   INITIAL IMPRESSION / ASSESSMENT AND PLAN / ED COURSE  Pertinent labs & imaging results that were available during my care of the patient were reviewed by me and considered in my medical decision making (see chart for details).  A shunt with a high fever or tachycardia and feeling generally unwell or vomiting. He did have abdominal pain but that is completely gone at this time. His abdomen is benign. Chest x-ray is reassuring lactic acid is reassuring white count is reassuring influenza however is negative and we do expect this might be positive. We also suppresses elevated liver function tests despite a benign abdomen. Given this we will start him on empiric antibiotics and obtain CT scan. Family is very concerned about his "vertigo" I don't think this represents meningitis is no meningismus or headache and his symptoms sound much more in tune with lightheadedness and illness for other than true vertigo. We are giving him IV fluids. Patient has been unable to give [place] urine sample we will obtain a urine catheterization prior chest starting antibiotics and given his liver function tests we are scanning him. He has had a cholecystectomy in the past.  ----------------------------------------- 11:20 PM on 02/16/2016 -----------------------------------------  Patient feels much better, mentating clearly, no evidence of incarcerated hernia on exam, positive liver function test abnormalities unclear. Hepatitis panel has been sent. I'm giving empiric anabiotic for high fever. No evidence of ascending cholangitis. Urinalysis is pending. White count is normal lactic is reassuring. Patient will require admission for all these problems. Blood cultures are pending. Again with no discomfort of any variety to deep palpation anywhere in his abdomen, negative CT scan, I don't think the patient requires emergent surgical intervention for this pathology. I  have discussed with the hospitalist and they agree. Etiology of acute hepatitis is not yet been determined.    ____________________________________________   FINAL CLINICAL IMPRESSION(S) / ED DIAGNOSES  Final diagnoses:  None      This chart was dictated using voice recognition software.  Despite best efforts to proofread,  errors can occur which can change meaning.      Schuyler Amor, MD 02/16/16 [id number]    Schuyler Amor, MD 02/16/16 [id number]    Schuyler Amor, MD 02/16/16 [place], MD 02/16/16 [phone]

## 2016-02-17 ENCOUNTER — Encounter: Payer: Self-pay | Admitting: Internal Medicine

## 2016-02-17 DIAGNOSIS — R945 Abnormal results of liver function studies: Secondary | ICD-10-CM | POA: Diagnosis not present

## 2016-02-17 DIAGNOSIS — R109 Unspecified abdominal pain: Secondary | ICD-10-CM | POA: Diagnosis not present

## 2016-02-17 DIAGNOSIS — R7989 Other specified abnormal findings of blood chemistry: Secondary | ICD-10-CM | POA: Diagnosis not present

## 2016-02-17 DIAGNOSIS — I10 Essential (primary) hypertension: Secondary | ICD-10-CM | POA: Diagnosis present

## 2016-02-17 DIAGNOSIS — E039 Hypothyroidism, unspecified: Secondary | ICD-10-CM | POA: Insufficient documentation

## 2016-02-17 DIAGNOSIS — K7200 Acute and subacute hepatic failure without coma: Secondary | ICD-10-CM | POA: Diagnosis present

## 2016-02-17 DIAGNOSIS — R740 Nonspecific elevation of levels of transaminase and lactic acid dehydrogenase [LDH]: Secondary | ICD-10-CM

## 2016-02-17 DIAGNOSIS — A419 Sepsis, unspecified organism: Secondary | ICD-10-CM | POA: Diagnosis not present

## 2016-02-17 DIAGNOSIS — A4151 Sepsis due to Escherichia coli [E. coli]: Secondary | ICD-10-CM | POA: Diagnosis present

## 2016-02-17 DIAGNOSIS — R7401 Elevation of levels of liver transaminase levels: Secondary | ICD-10-CM | POA: Diagnosis present

## 2016-02-17 LAB — CBC
HEMATOCRIT: 41.1 % (ref 40.0–52.0)
Hemoglobin: 13.9 g/dL (ref 13.0–18.0)
MCH: 29.1 pg (ref 26.0–34.0)
MCHC: 33.9 g/dL (ref 32.0–36.0)
MCV: 86.1 fL (ref 80.0–100.0)
Platelets: 227 10*3/uL (ref 150–440)
RBC: 4.77 MIL/uL (ref 4.40–5.90)
RDW: 13.9 % (ref 11.5–14.5)
WBC: 10.9 10*3/uL — AB (ref 3.8–10.6)

## 2016-02-17 LAB — BLOOD CULTURE ID PANEL (REFLEXED)
ACINETOBACTER BAUMANNII: NOT DETECTED
CANDIDA ALBICANS: NOT DETECTED
CANDIDA GLABRATA: NOT DETECTED
CANDIDA PARAPSILOSIS: NOT DETECTED
CANDIDA TROPICALIS: NOT DETECTED
Candida krusei: NOT DETECTED
Carbapenem resistance: NOT DETECTED
ENTEROBACTER CLOACAE COMPLEX: NOT DETECTED
ENTEROBACTERIACEAE SPECIES: DETECTED — AB
Enterococcus species: NOT DETECTED
Escherichia coli: DETECTED — AB
HAEMOPHILUS INFLUENZAE: NOT DETECTED
KLEBSIELLA PNEUMONIAE: NOT DETECTED
Klebsiella oxytoca: NOT DETECTED
Listeria monocytogenes: NOT DETECTED
NEISSERIA MENINGITIDIS: NOT DETECTED
PROTEUS SPECIES: NOT DETECTED
Pseudomonas aeruginosa: NOT DETECTED
STREPTOCOCCUS AGALACTIAE: NOT DETECTED
STREPTOCOCCUS PYOGENES: NOT DETECTED
STREPTOCOCCUS SPECIES: NOT DETECTED
Serratia marcescens: NOT DETECTED
Staphylococcus aureus (BCID): NOT DETECTED
Staphylococcus species: NOT DETECTED
Streptococcus pneumoniae: NOT DETECTED

## 2016-02-17 LAB — COMPREHENSIVE METABOLIC PANEL
ALT: 615 U/L — ABNORMAL HIGH (ref 17–63)
ANION GAP: 6 (ref 5–15)
AST: 663 U/L — ABNORMAL HIGH (ref 15–41)
Albumin: 3.4 g/dL — ABNORMAL LOW (ref 3.5–5.0)
Alkaline Phosphatase: 230 U/L — ABNORMAL HIGH (ref 38–126)
BILIRUBIN TOTAL: 2.9 mg/dL — AB (ref 0.3–1.2)
BUN: 9 mg/dL (ref 6–20)
CHLORIDE: 104 mmol/L (ref 101–111)
CO2: 27 mmol/L (ref 22–32)
Calcium: 8.5 mg/dL — ABNORMAL LOW (ref 8.9–10.3)
Creatinine, Ser: 1.03 mg/dL (ref 0.61–1.24)
Glucose, Bld: 104 mg/dL — ABNORMAL HIGH (ref 65–99)
POTASSIUM: 3.7 mmol/L (ref 3.5–5.1)
Sodium: 137 mmol/L (ref 135–145)
TOTAL PROTEIN: 6.8 g/dL (ref 6.5–8.1)

## 2016-02-17 MED ORDER — SODIUM CHLORIDE 0.9% FLUSH
3.0000 mL | Freq: Two times a day (BID) | INTRAVENOUS | Status: DC
  Administered 2016-02-17: 3 mL via INTRAVENOUS

## 2016-02-17 MED ORDER — IBUPROFEN 400 MG PO TABS
400.0000 mg | ORAL_TABLET | Freq: Four times a day (QID) | ORAL | Status: DC | PRN
  Administered 2016-02-17: 400 mg via ORAL
  Filled 2016-02-17 (×2): qty 1

## 2016-02-17 MED ORDER — ENOXAPARIN SODIUM 40 MG/0.4ML ~~LOC~~ SOLN
40.0000 mg | SUBCUTANEOUS | Status: DC
  Administered 2016-02-17 – 2016-02-18 (×2): 40 mg via SUBCUTANEOUS
  Filled 2016-02-17 (×2): qty 0.4

## 2016-02-17 MED ORDER — ONDANSETRON HCL 4 MG/2ML IJ SOLN: 4.0000 mg | Freq: Four times a day (QID) | INTRAMUSCULAR | Status: DC | PRN

## 2016-02-17 MED ORDER — MEROPENEM-SODIUM CHLORIDE 1 GM/50ML IV SOLR
1.0000 g | Freq: Three times a day (TID) | INTRAVENOUS | Status: DC
  Administered 2016-02-17 – 2016-02-19 (×6): 1 g via INTRAVENOUS
  Filled 2016-02-17 (×8): qty 50

## 2016-02-17 MED ORDER — SODIUM CHLORIDE 0.9 % IV SOLN
1.0000 g | Freq: Three times a day (TID) | INTRAVENOUS | Status: DC
  Filled 2016-02-17 (×2): qty 1

## 2016-02-17 MED ORDER — VANCOMYCIN HCL 10 G IV SOLR
1500.0000 mg | Freq: Two times a day (BID) | INTRAVENOUS | Status: DC
  Filled 2016-02-17 (×2): qty 1500

## 2016-02-17 MED ORDER — PIPERACILLIN-TAZOBACTAM 3.375 G IVPB
3.3750 g | Freq: Three times a day (TID) | INTRAVENOUS | Status: DC
  Administered 2016-02-17: 3.375 g via INTRAVENOUS
  Filled 2016-02-17: qty 50

## 2016-02-17 MED ORDER — SODIUM CHLORIDE 0.9 % IV SOLN
INTRAVENOUS | Status: DC
  Administered 2016-02-17 – 2016-02-19 (×4): via INTRAVENOUS

## 2016-02-17 MED ORDER — ONDANSETRON HCL 4 MG PO TABS: 4.0000 mg | ORAL_TABLET | Freq: Four times a day (QID) | ORAL | Status: DC | PRN

## 2016-02-17 MED ORDER — VANCOMYCIN HCL 10 G IV SOLR
1500.0000 mg | Freq: Once | INTRAVENOUS | Status: AC
  Administered 2016-02-17: 1500 mg via INTRAVENOUS
  Filled 2016-02-17: qty 1500

## 2016-02-17 NOTE — Progress Notes (Signed)
PHARMACY - PHYSICIAN COMMUNICATION CRITICAL VALUE ALERT - BLOOD CULTURE IDENTIFICATION (BCID)  Results for orders placed or performed during the hospital encounter of 02/16/16  Blood Culture ID Panel (Reflexed) (Collected: 02/16/2016  8:59 PM)  Result Value Ref Range   Enterococcus species NOT DETECTED NOT DETECTED   Listeria monocytogenes NOT DETECTED NOT DETECTED   Staphylococcus species NOT DETECTED NOT DETECTED   Staphylococcus aureus NOT DETECTED NOT DETECTED   Streptococcus species NOT DETECTED NOT DETECTED   Streptococcus agalactiae NOT DETECTED NOT DETECTED   Streptococcus pneumoniae NOT DETECTED NOT DETECTED   Streptococcus pyogenes NOT DETECTED NOT DETECTED   Acinetobacter baumannii NOT DETECTED NOT DETECTED   Enterobacteriaceae species DETECTED (A) NOT DETECTED   Enterobacter cloacae complex NOT DETECTED NOT DETECTED   Escherichia coli DETECTED (A) NOT DETECTED   Klebsiella oxytoca NOT DETECTED NOT DETECTED   Klebsiella pneumoniae NOT DETECTED NOT DETECTED   Proteus species NOT DETECTED NOT DETECTED   Serratia marcescens NOT DETECTED NOT DETECTED   Carbapenem resistance NOT DETECTED NOT DETECTED   Haemophilus influenzae NOT DETECTED NOT DETECTED   Neisseria meningitidis NOT DETECTED NOT DETECTED   Pseudomonas aeruginosa NOT DETECTED NOT DETECTED   Candida albicans NOT DETECTED NOT DETECTED   Candida glabrata NOT DETECTED NOT DETECTED   Candida krusei NOT DETECTED NOT DETECTED   Candida parapsilosis NOT DETECTED NOT DETECTED   Candida tropicalis NOT DETECTED NOT DETECTED    Name of physician (or Provider) Contacted: Dr. Manuella Ghazi  Changes to prescribed antibiotics required: Will transition patient from piperacillin/tazobactam and vancomycin to meropenem 1 g IV q 8 hours per BCID protocol.  Darrow Bussing, PharmD Pharmacy Resident 02/17/2016 1:52 PM

## 2016-02-17 NOTE — Progress Notes (Signed)
Pharmacy Antibiotic Note  Jeremiah Price is a 71 y.o. male admitted on 02/16/2016 with sepsis.  Pharmacy has been consulted for vancomycin and Zosyn dosing.  Plan: DW 102 kg  Vd 72L kei 0.082 hr-1  T1/2 8 hours Vancomycin 1500 mg q 12 hours ordered with stacked dosing. Level before 5th dose. Goal trough 15-20  Zosyn 3.375 grams q 8 hours ordered.  Height: 6\' 3"  (190.5 cm) Weight: 226 lb (102.5 kg) IBW/kg (Calculated) : 84.5  Temp (24hrs), Avg:99.9 F (37.7 C), Min:98.6 F (37 C), Max:103 F (39.4 C)   Recent Labs Lab 02/16/16 2058 02/16/16 2250  WBC 8.0  --   CREATININE 0.99  --   LATICACIDVEN 1.4 1.3    Estimated Creatinine Clearance: 92.6 mL/min (by C-G formula based on SCr of 0.99 mg/dL).    No Known Allergies  Antimicrobials this admission: vancomycin 2/10 >>  Zosyn 2/9 >>   Dose adjustments this admission:   Microbiology results: 2/9  BCx: pending 2/9 UCx: pending     2/9 UA: (-) 2/9 CXR: no active disease  Thank you for allowing pharmacy to be a part of this patient's care.  [name] S 02/17/2016 6:47 AM

## 2016-02-17 NOTE — Consult Note (Signed)
Consultation  Referring Provider:     No ref. provider found Primary Care Physician:  No primary care provider on file. Primary Gastroenterologist:  None Reason for Consultation:   Transminitis  Date of Admission:  02/16/2016 Date of Consultation:  02/17/2016         HPI:   Jeremiah Price is a 71 y.o. male with a history notable for hypertension, vertigo and hypothyroidism who presents for evaluation of abdominal pain, nausea and emesis and was found to have a significant transaminitis.   The patient reports feeling well until Thursday when he started experiencing vertigo again. That evening he started feeling sick and having nausea and emesis, which he initially attributed to vertigo. He presented for evaluation on Friday evening when he was not improving despite improvement in vertigo.   The patient and his wife do not recall ever being told he has a problem with his liver or having a family history of liver problems. He did have a cholecystectomy 5-6 years ago, but that was for uncomplicated cholelithiasis. He denies any new medications, herbal remedies, supplements, travel, unusual food exposures, tattoos or sick contacts in the past 6 months. He was in the [employer] in the 1970s and posted to [place]. He denies any alcohol use after the 1960s when he did drink heavily. He also denies any recreational drug use.   He does use 1-2 extra strength tylenol less than once a week and more recently tried OTC ibuprofen 1-2 tablets about 2 times. He does yard work lifting heavy weights often, but does not recall more than usual physical activity recently.   He denies scleral icterus, jaundice, d/c, f/s/c, recent changes in weight or appetite, or evidence of GI bleeding.  Compared to when he presented to the hospital, he feels much better.  Past Medical History:  Diagnosis Date  . Hypertension   . Thyroid disease     Past Surgical History:  Procedure Laterality Date  . CHOLECYSTECTOMY       Prior to Admission medications   Not on File    Family History  Problem Relation Age of Onset  . CAD Neg Hx      Social History  Substance Use Topics  . Smoking status: Never Smoker  . Smokeless tobacco: Never Used  . Alcohol use No    Allergies as of 02/16/2016  . (No Known Allergies)    Review of Systems:    All systems reviewed and negative except where noted in HPI.   Physical Exam:  Vital signs in last 24 hours: Temp:  [98.6 F (37 C)-103 F (39.4 C)] 98.8 F (37.1 C) (02/10 0732) Pulse Rate:  [84-115] 84 (02/10 0732) Resp:  [16-35] 17 (02/10 0732) BP: (94-184)/(54-97) 124/77 (02/10 0732) SpO2:  [88 %-100 %] 100 % (02/10 0732) Weight:  [102.5 kg (226 lb)] 102.5 kg (226 lb) (02/09 2014) Last BM Date: 02/16/16   General: Older black male sitting up comfortably in a hospital bed having eaten most of his pancake breakfast. Head:  Normocephalic Eyes: No scleral icterus appreciated Ears:  Normal auditory acuity. Oropharynx: MMM, no gross lesions appreciated Lungs: Respirations even and unlabored. Lungs clear to auscultation bilaterally. Heart:  Regular rate and rhythm, normal S1, S2 without murmurs Abdomen: +BS, soft, mildly TTP in RUQ and RLQ without r/g, non-distended, no hepatosplenomegaly appreciated Rectal: Not indicated Msk: Symmetrical without gross deformities Extremities: warm without edema Neurologic: Grossly unremarkable Skin: No jaundice appreciated, intact without visible lesions or rashes. Psych:  Alert and  cooperative. Normal affect.  LAB RESULTS:  Recent Labs  02/16/16 2058 02/17/16 0648  WBC 8.0 10.9*  HGB 14.6 13.9  HCT 42.3 41.1  PLT 258 227   BMET  Recent Labs  02/16/16 2058 02/17/16 0648  NA 136 137  K 3.5 3.7  CL 100* 104  CO2 28 27  GLUCOSE 166* 104*  BUN 10 9  CREATININE 0.99 1.03  CALCIUM 9.1 8.5*   LFT  Recent Labs  02/17/16 0648  PROT 6.8  ALBUMIN 3.4*  AST 663*  ALT 615*  ALKPHOS 230*  BILITOT 2.9*    PT/INR  Recent Labs  02/16/16 2058  LABPROT 13.5  INR 1.03    STUDIES: Dg Chest 2 View  Result Date: 02/16/2016 CLINICAL DATA:  Dizziness, abdominal pain, and chills since yesterday. EXAM: CHEST  2 VIEW COMPARISON:  10/24/2012 FINDINGS: The heart size and mediastinal contours are within normal limits. Both lungs are clear. The visualized skeletal structures are unremarkable. IMPRESSION: No active cardiopulmonary disease. Electronically Signed   By: Lucienne Capers M.D.   On: 02/16/2016 21:18   Ct Head Wo Contrast  Result Date: 02/16/2016 CLINICAL DATA:  Dizziness, abdominal pain, and chills since yesterday. Nausea. Fever. Tachycardia. EXAM: CT HEAD WITHOUT CONTRAST TECHNIQUE: Contiguous axial images were obtained from the base of the skull through the vertex without intravenous contrast. COMPARISON:  MRI brain 10/26/2012.  CT head 10/24/2012. FINDINGS: Brain: Diffuse cerebral atrophy. Small bilateral chronic subdural hygromas. No mass effect or midline shift. No abnormal extra-axial fluid collections. Gray-white matter junctions are distinct. Basal cisterns are not effaced. No ventricular dilatation. No evidence of acute intracranial hemorrhage. Vascular: No hyperdense vessel or unexpected calcification. Skull: Normal. Negative for fracture or focal lesion. Sinuses/Orbits: No acute finding. Other: No significant changes since prior study. IMPRESSION: No acute intracranial abnormalities. Electronically Signed   By: Lucienne Capers M.D.   On: 02/16/2016 22:25   Ct Abdomen Pelvis W Contrast  Result Date: 02/16/2016 CLINICAL DATA:  Fever and abdominal pain EXAM: CT ABDOMEN AND PELVIS WITH CONTRAST TECHNIQUE: Multidetector CT imaging of the abdomen and pelvis was performed using the standard protocol following bolus administration of intravenous contrast. CONTRAST:  158mL ISOVUE-300 IOPAMIDOL (ISOVUE-300) INJECTION 61% COMPARISON:  CT abdomen pelvis 01/31/2006 FINDINGS: Lower chest: No pulmonary  nodules. No visible pleural or pericardial effusion. There are coronary artery calcifications. Hepatobiliary: Normal hepatic size and contours without focal liver lesion. No perihepatic ascites. No intra- or extrahepatic biliary dilatation. Status post cholecystectomy. Pancreas: Normal pancreatic contours and enhancement. No peripancreatic fluid collection or pancreatic ductal dilatation. Spleen: Normal. Adrenals/Urinary Tract: Normal adrenal glands. No hydronephrosis or solid renal mass. Stomach/Bowel: No abnormal bowel dilatation. No bowel wall thickening or adjacent fat stranding to indicate acute inflammation. No abdominal fluid collection. Normal appendix. Vascular/Lymphatic: There is extensive aortic atherosclerotic calcification and noncalcified plaque. There is an infrarenal abdominal aortic aneurysm measuring 3.2 cm in diameter, previously 2.3 cm. 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 These have increased in size slightly (previously 2.0 cm on the right and 1.5 cm on the left). 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 No abdominal or pelvic adenopathy. Reproductive: Normal prostate and seminal vesicles. No free fluid in the pelvis. Musculoskeletal: Left lumbosacral assimilation joint. No lytic or blastic lesions. No bony spinal canal  stenosis. Normal visualized extrathoracic and extraperitoneal soft tissues. Other: There is a right inguinal hernia containing fat and a small amount of small bowel without evidence of obstruction. IMPRESSION: 1. No acute abnormality of the abdomen or pelvis. 2. 3.2 cm infrarenal abdominal aortic aneurysm, increased from 2.3 cm previously. Recommend followup by ultrasound in 3 years. This recommendation follows ACR consensus guidelines: White Paper of the ACR Incidental Findings  Committee II on Vascular Findings. J Am Coll Radiol 2013; [id number] 3. 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 4. Small right inguinal hernia containing fat and a loop of small bowel without evidence of obstruction or ischemia. 5. Coronary artery and aortic atherosclerosis. Electronically Signed   By: Ulyses Jarred M.D.   On: 02/16/2016 22:34     Impression / Plan:   Jeremiah Price is a 71 y.o. y/o male with a history notable for hypertension, hypothyroidism and vertigo whom I am seeing in consultation for transaminitis. It is reassuring that the transaminitis is improving over the admission and he does not have evidence of synthetic liver dysfunction with a normal INR and albumin. The differential for the patient's transaminitis is broad: systemic etiologies like infections are most likely given the AST predominance over ALT, acute viral hepatitis versus other systemic viral etiologies, which can certainly result in a hepatitis, although that is less likely in a person without underlying liver disease. However, as he does not seem to obtain much medical care, we cannot completely exclude the possibility that he does not have underlying liver disease. It is curious that his bilirubin is rising as his transaminases are falling and that certainly argues for further work up and monitoring. A drug induced liver injury is certainly possible, although he denies recently starting or changing the doses of any medications, prescribed or over the counter. 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   - fractionate bilirubin - add on acetaminophen level to admission labs - add on CK to admission labs - I am unsure what the acute hepatitis panel includes at this institution, but please ensure that the following  hepatitis serologies are included: HAV IgM, HAV IgG, HBV sAg, HBV sAB and HBV cAb, HCV Ab and HCV PCR it is less likely that the patient have another acute viral hepatitis as the presentation for those hepatidites tend to be more fulminant - check urine drug screen - obtain liver ultrasound with dopplers to evaluate hepatic vasculature - monitor hepatic function panel daily - narrow broad spectrum antibiotics if no source of bacterial infection is identified within 48 hours of admission - if the patient is non-immune to HAV or HBV, please initiate vaccination prior to discharge  Thank you for involving me in the care of this patient. I will continue to follow along.     LOS: 0 days   Lisbeth Renshaw, MD  02/17/2016, 12:08 PM

## 2016-02-17 NOTE — H&P (Signed)
[facility] [place] NAME: Jeremiah Price    MR#:  [id number]  DATE OF BIRTH:  1945/12/16  DATE OF ADMISSION:  02/16/2016  PRIMARY CARE PHYSICIAN: No primary care provider on file.   REQUESTING/REFERRING PHYSICIAN: Burlene Arnt, MD  CHIEF COMPLAINT:   Chief Complaint  Patient presents with  . Dizziness  . Abdominal Pain  . Chills    HISTORY OF PRESENT ILLNESS:  Jeremiah Price  is a 71 y.o. male who presents with Acute onset abdominal pain with nausea and vomiting, dizziness, chills. Here in the ED initially looks to meet sepsis criteria with tachycardia, and left shift on his CBC differential. However, chest x-ray did not show any pneumonia, UA did not look like it was infected, CT abdomen and pelvis did not show any significant acute or infectious abnormality. However, his transaminases were significantly elevated on blood work. Hepatitis panel was ordered as prior preliminary workup and hospitals were called for admission and further evaluation.  PAST MEDICAL HISTORY:   Past Medical History:  Diagnosis Date  . Hypertension   . Thyroid disease     PAST SURGICAL HISTORY:   Past Surgical History:  Procedure Laterality Date  . CHOLECYSTECTOMY      SOCIAL HISTORY:   Social History  Substance Use Topics  . Smoking status: Never Smoker  . Smokeless tobacco: Never Used  . Alcohol use No    FAMILY HISTORY:   Family History  Problem Relation Age of Onset  . CAD Neg Hx     DRUG ALLERGIES:  No Known Allergies  MEDICATIONS AT HOME:   Prior to Admission medications   Not on File    REVIEW OF SYSTEMS:  Review of Systems  Constitutional: Positive for chills and fever. Negative for malaise/fatigue and weight loss.  HENT: Negative for ear pain, hearing loss and tinnitus.   Eyes: Negative for blurred vision, double vision, pain and redness.  Respiratory: Negative for cough, hemoptysis and shortness of breath.    Cardiovascular: Negative for chest pain, palpitations, orthopnea and leg swelling.  Gastrointestinal: Positive for abdominal pain, nausea and vomiting. Negative for constipation and diarrhea.  Genitourinary: Negative for dysuria, frequency and hematuria.  Musculoskeletal: Negative for back pain, joint pain and neck pain.  Skin:       No acne, rash, or lesions  Neurological: Positive for dizziness. Negative for tremors, focal weakness and weakness.  Endo/Heme/Allergies: Negative for polydipsia. Does not bruise/bleed easily.  Psychiatric/Behavioral: Negative for depression. The patient is not nervous/anxious and does not have insomnia.      VITAL SIGNS:   Vitals:   02/16/16 2245 02/16/16 2300 02/16/16 2315 02/16/16 2319  BP: 140/82 140/87  (!) 142/83  Pulse: (!) 110 (!) 107 (!) 107 (!) 109  Resp: (!) 25 (!) 26 (!) 27 (!) 25  Temp:    98.6 F (37 C)  TempSrc:    Oral  SpO2: 93% 93% 94% 94%  Weight:      Height:       Wt Readings from Last 3 Encounters:  02/16/16 102.5 kg (226 lb)    PHYSICAL EXAMINATION:  Physical Exam  Vitals reviewed. Constitutional: He is oriented to person, place, and time. He appears well-developed and well-nourished. No distress.  HENT:  Head: Normocephalic and atraumatic.  Mouth/Throat: Oropharynx is clear and moist.  Eyes: Conjunctivae and EOM are normal. Pupils are equal, round, and reactive to light. No scleral icterus.  Neck: Normal range of motion. Neck supple.  [name] present. No thyromegaly present.  Cardiovascular: Normal rate, regular rhythm and intact distal pulses.  Exam reveals no gallop and no friction rub.   No murmur heard. Respiratory: Effort normal and breath sounds normal. No respiratory distress. He has no wheezes. He has no rales.  GI: Soft. Bowel sounds are normal. He exhibits no distension. There is no tenderness.  Musculoskeletal: Normal range of motion. He exhibits no edema.  No arthritis, no gout  Lymphadenopathy:    He has  no cervical adenopathy.  Neurological: He is alert and oriented to person, place, and time. No cranial nerve deficit.  No dysarthria, no aphasia  Skin: Skin is warm and dry. No rash noted. No erythema.  Psychiatric: He has a normal mood and affect. His behavior is normal. Judgment and thought content normal.    LABORATORY PANEL:   CBC  Recent Labs Lab 02/16/16 2058  WBC 8.0  HGB 14.6  HCT 42.3  PLT 258   ------------------------------------------------------------------------------------------------------------------  Chemistries   Recent Labs Lab 02/16/16 2058  NA 136  K 3.5  CL 100*  CO2 28  GLUCOSE 166*  BUN 10  CREATININE 0.99  CALCIUM 9.1  AST 911*  ALT 566*  ALKPHOS 237*  BILITOT 2.2*   ------------------------------------------------------------------------------------------------------------------  Cardiac Enzymes No results for input(s): TROPONINI in the last 168 hours. ------------------------------------------------------------------------------------------------------------------  RADIOLOGY:  Dg Chest 2 View  Result Date: 02/16/2016 CLINICAL DATA:  Dizziness, abdominal pain, and chills since yesterday. EXAM: CHEST  2 VIEW COMPARISON:  10/24/2012 FINDINGS: The heart size and mediastinal contours are within normal limits. Both lungs are clear. The visualized skeletal structures are unremarkable. IMPRESSION: No active cardiopulmonary disease. Electronically Signed   By: Lucienne Capers M.D.   On: 02/16/2016 21:18   Ct Head Wo Contrast  Result Date: 02/16/2016 CLINICAL DATA:  Dizziness, abdominal pain, and chills since yesterday. Nausea. Fever. Tachycardia. EXAM: CT HEAD WITHOUT CONTRAST TECHNIQUE: Contiguous axial images were obtained from the base of the skull through the vertex without intravenous contrast. COMPARISON:  MRI brain 10/26/2012.  CT head 10/24/2012. FINDINGS: Brain: Diffuse cerebral atrophy. Small bilateral chronic subdural hygromas. No mass  effect or midline shift. No abnormal extra-axial fluid collections. Gray-white matter junctions are distinct. Basal cisterns are not effaced. No ventricular dilatation. No evidence of acute intracranial hemorrhage. Vascular: No hyperdense vessel or unexpected calcification. Skull: Normal. Negative for fracture or focal lesion. Sinuses/Orbits: No acute finding. Other: No significant changes since prior study. IMPRESSION: No acute intracranial abnormalities. Electronically Signed   By: Lucienne Capers M.D.   On: 02/16/2016 22:25   Ct Abdomen Pelvis W Contrast  Result Date: 02/16/2016 CLINICAL DATA:  Fever and abdominal pain EXAM: CT ABDOMEN AND PELVIS WITH CONTRAST TECHNIQUE: Multidetector CT imaging of the abdomen and pelvis was performed using the standard protocol following bolus administration of intravenous contrast. CONTRAST:  16mL ISOVUE-300 IOPAMIDOL (ISOVUE-300) INJECTION 61% COMPARISON:  CT abdomen pelvis 01/31/2006 FINDINGS: Lower chest: No pulmonary nodules. No visible pleural or pericardial effusion. There are coronary artery calcifications. Hepatobiliary: Normal hepatic size and contours without focal liver lesion. No perihepatic ascites. No intra- or extrahepatic biliary dilatation. Status post cholecystectomy. Pancreas: Normal pancreatic contours and enhancement. No peripancreatic fluid collection or pancreatic ductal dilatation. Spleen: Normal. Adrenals/Urinary Tract: Normal adrenal glands. No hydronephrosis or solid renal mass. Stomach/Bowel: No abnormal bowel dilatation. No bowel wall thickening or adjacent fat stranding to indicate acute inflammation. No abdominal fluid collection. Normal appendix. Vascular/Lymphatic: There is extensive aortic atherosclerotic calcification and noncalcified plaque.  There is an infrarenal abdominal aortic aneurysm measuring 3.2 cm in diameter, previously 2.3 cm. 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 These have increased in size slightly (previously 2.0 cm on the right and 1.5 cm on the left). 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 No abdominal or pelvic adenopathy. Reproductive: Normal prostate and seminal vesicles. No free fluid in the pelvis. Musculoskeletal: Left lumbosacral assimilation joint. No lytic or blastic lesions. No bony spinal canal stenosis. Normal visualized extrathoracic and extraperitoneal soft tissues. Other: There is a right inguinal hernia containing fat and a small amount of small bowel without evidence of obstruction. IMPRESSION: 1. No acute abnormality of the abdomen or pelvis. 2. 3.2 cm infrarenal abdominal aortic aneurysm, increased from 2.3 cm previously. Recommend followup by ultrasound in 3 years. This recommendation follows ACR consensus guidelines: White Paper of the ACR Incidental Findings Committee II on Vascular Findings. J Am Coll Radiol 2013; [id number] 3. 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 4. Small right inguinal hernia containing fat and a loop of small bowel without evidence of obstruction or ischemia. 5. Coronary artery and aortic atherosclerosis. Electronically Signed   By: Ulyses Jarred M.D.   On: 02/16/2016 22:34    EKG:   Orders placed or performed during the hospital encounter of 02/16/16  . EKG 12-Lead  . EKG 12-Lead    IMPRESSION AND PLAN:  Principal Problem:   Transaminitis - strong suspicion for possible acute hepatitis. Dose of Zosyn was given in the ED for coverage, hepatitis panel pending. If the hepatitis panel is negative he will likely need antibiotics (Vanco and Zosyn) continued. GI consult ordered to assist with further evaluation and treatment Active Problems:   Abdominal pain - much improved here in the ED, as well as his nausea and vomiting. We  will keep. Analgesic and antiemetics on board. IV fluids for hydration   HTN (hypertension) - continue home meds  All the records are reviewed and case discussed with ED provider. Management plans discussed with the patient and/or family.  DVT PROPHYLAXIS: SubQ lovenox  GI PROPHYLAXIS: None  ADMISSION STATUS: Inpatient  CODE STATUS: Full Code Status History    This patient does not have a recorded code status. Please follow your organizational policy for patients in this situation.    Advance Directive Documentation   Flowsheet Row Most Recent Value  Type of Advance Directive  Healthcare Power of Attorney, Living will  Pre-existing out of facility DNR order (yellow form or pink MOST form)  No data  "MOST" Form in Place?  No data      TOTAL TIME TAKING CARE OF THIS PATIENT: 45 minutes.    Jeremiah Price [place] 02/17/2016, 1:11 AM  [facility]  Office  [phone]  CC: Primary care physician; No primary care provider on file.

## 2016-02-17 NOTE — Progress Notes (Addendum)
[facility] [place] NAME: Jeremiah Price    MR#:  [id number]  DATE OF BIRTH:  [birth year]  SUBJECTIVE:  CHIEF COMPLAINT:   Chief Complaint  Patient presents with  . Dizziness  . Abdominal Pain  . Chills  feels fine now. Wants to eat, family at bedside REVIEW OF SYSTEMS:  Review of Systems  Constitutional: Negative for chills, fever and weight loss.  HENT: Negative for nosebleeds and sore throat.   Eyes: Negative for blurred vision.  Respiratory: Negative for cough, shortness of breath and wheezing.   Cardiovascular: Negative for chest pain, orthopnea, leg swelling and PND.  Gastrointestinal: Positive for abdominal pain, nausea and vomiting. Negative for constipation, diarrhea and heartburn.  Genitourinary: Negative for dysuria and urgency.  Musculoskeletal: Negative for back pain.  Skin: Negative for rash.  Neurological: Negative for dizziness, speech change, focal weakness and headaches.  Endo/Heme/Allergies: Does not bruise/bleed easily.  Psychiatric/Behavioral: Negative for depression.    DRUG ALLERGIES:  No Known Allergies VITALS:  Blood pressure 124/77, pulse 84, temperature 98.8 F (37.1 C), temperature source Oral, resp. rate 17, height 6\' 3"  (1.905 m), weight 102.5 kg (226 lb), SpO2 100 %. PHYSICAL EXAMINATION:  Physical Exam  Constitutional: He is oriented to person, place, and time and well-developed, well-nourished, and in no distress.  HENT:  Head: Normocephalic and atraumatic.  Eyes: Conjunctivae and EOM are normal. Pupils are equal, round, and reactive to light.  Neck: Normal range of motion. Neck supple. No tracheal deviation present. No thyromegaly present.  Cardiovascular: Normal rate, regular rhythm and normal heart sounds.   Pulmonary/Chest: Effort normal and breath sounds normal. No respiratory distress. He has no wheezes. He exhibits no tenderness.  Abdominal: Soft. Bowel sounds are normal. He exhibits no  distension. There is no tenderness.  Musculoskeletal: Normal range of motion.  Neurological: He is alert and oriented to person, place, and time. No cranial nerve deficit.  Skin: Skin is warm and dry. No rash noted.  Psychiatric: Mood and affect normal.   LABORATORY PANEL:   CBC  Recent Labs Lab 02/17/16 0648  WBC 10.9*  HGB 13.9  HCT 41.1  PLT 227   ------------------------------------------------------------------------------------------------------------------ Chemistries   Recent Labs Lab 02/17/16 0648  NA 137  K 3.7  CL 104  CO2 27  GLUCOSE 104*  BUN 9  CREATININE 1.03  CALCIUM 8.5*  AST 663*  ALT 615*  ALKPHOS 230*  BILITOT 2.9*   RADIOLOGY:  Dg Chest 2 View  Result Date: 02/16/2016 CLINICAL DATA:  Dizziness, abdominal pain, and chills since yesterday. EXAM: CHEST  2 VIEW COMPARISON:  10/24/2012 FINDINGS: The heart size and mediastinal contours are within normal limits. Both lungs are clear. The visualized skeletal structures are unremarkable. IMPRESSION: No active cardiopulmonary disease. Electronically Signed   By: Lucienne Capers M.D.   On: 02/16/2016 21:18   Ct Head Wo Contrast  Result Date: 02/16/2016 CLINICAL DATA:  Dizziness, abdominal pain, and chills since yesterday. Nausea. Fever. Tachycardia. EXAM: CT HEAD WITHOUT CONTRAST TECHNIQUE: Contiguous axial images were obtained from the base of the skull through the vertex without intravenous contrast. COMPARISON:  MRI brain 10/26/2012.  CT head 10/24/2012. FINDINGS: Brain: Diffuse cerebral atrophy. Small bilateral chronic subdural hygromas. No mass effect or midline shift. No abnormal extra-axial fluid collections. Gray-white matter junctions are distinct. Basal cisterns are not effaced. No ventricular dilatation. No evidence of acute intracranial hemorrhage. Vascular: No hyperdense vessel or unexpected calcification. Skull: Normal. Negative for fracture  or focal lesion. Sinuses/Orbits: No acute finding. Other:  No significant changes since prior study. IMPRESSION: No acute intracranial abnormalities. Electronically Signed   By: Lucienne Capers M.D.   On: 02/16/2016 22:25   Ct Abdomen Pelvis W Contrast  Result Date: 02/16/2016 CLINICAL DATA:  Fever and abdominal pain EXAM: CT ABDOMEN AND PELVIS WITH CONTRAST TECHNIQUE: Multidetector CT imaging of the abdomen and pelvis was performed using the standard protocol following bolus administration of intravenous contrast. CONTRAST:  13mL ISOVUE-300 IOPAMIDOL (ISOVUE-300) INJECTION 61% COMPARISON:  CT abdomen pelvis 01/31/2006 FINDINGS: Lower chest: No pulmonary nodules. No visible pleural or pericardial effusion. There are coronary artery calcifications. Hepatobiliary: Normal hepatic size and contours without focal liver lesion. No perihepatic ascites. No intra- or extrahepatic biliary dilatation. Status post cholecystectomy. Pancreas: Normal pancreatic contours and enhancement. No peripancreatic fluid collection or pancreatic ductal dilatation. Spleen: Normal. Adrenals/Urinary Tract: Normal adrenal glands. No hydronephrosis or solid renal mass. Stomach/Bowel: No abnormal bowel dilatation. No bowel wall thickening or adjacent fat stranding to indicate acute inflammation. No abdominal fluid collection. Normal appendix. Vascular/Lymphatic: There is extensive aortic atherosclerotic calcification and noncalcified plaque. There is an infrarenal abdominal aortic aneurysm measuring 3.2 cm in diameter, previously 2.3 cm. 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 These have increased in size slightly (previously 2.0 cm on the right and 1.5 cm on the left). 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 No abdominal or pelvic adenopathy. Reproductive: Normal prostate and seminal vesicles. No free fluid  in the pelvis. Musculoskeletal: Left lumbosacral assimilation joint. No lytic or blastic lesions. No bony spinal canal stenosis. Normal visualized extrathoracic and extraperitoneal soft tissues. Other: There is a right inguinal hernia containing fat and a small amount of small bowel without evidence of obstruction. IMPRESSION: 1. No acute abnormality of the abdomen or pelvis. 2. 3.2 cm infrarenal abdominal aortic aneurysm, increased from 2.3 cm previously. Recommend followup by ultrasound in 3 years. This recommendation follows ACR consensus guidelines: White Paper of the ACR Incidental Findings Committee II on Vascular Findings. J Am Coll Radiol 2013; [id number] 3. 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 4. Small right inguinal hernia containing fat and a loop of small bowel without evidence of obstruction or ischemia. 5. Coronary artery and aortic atherosclerosis. Electronically Signed   By: Ulyses Jarred M.D.   On: 02/16/2016 22:34   ASSESSMENT AND PLAN:  71 y.o. male who presents with Acute onset abdominal pain with nausea and vomiting, dizziness, chills.   * sepsis: present on admission. Unknown source. - Blood c/s + for E.coli and enterobacteriaceae - change abx to meropenem, stop vanco + zosyn - ID c/s  * Transaminitis - strong suspicion for possible acute hepatitis. GI consult pending - Pending hepatitis panel  * Abdominal pain - much improved as well as his nausea and vomiting. Symptomatic mgmt  * HTN (hypertension) - continue home meds     All the records are reviewed and case discussed with Care Management/Social Worker. Management plans discussed with the patient, family and they are in agreement.  CODE STATUS: full code  TOTAL TIME TAKING CARE OF THIS PATIENT: 35 minutes.   More than 50% of the time was spent in counseling/coordination of care: YES  POSSIBLE D/C IN 1-2 DAYS, DEPENDING  ON CLINICAL CONDITION. And GI eval      Max Sane M.D on 02/17/2016 at 12:15 PM  Between 7am to 6pm - Pager - [phone]  After 6pm go to [email or web address] - [facility] [facility]  Office  [phone]  CC: Primary care physician; No primary care provider on file.  Note: This dictation was prepared with Dragon dictation along with smaller phrase technology. Any transcriptional errors that result from this process are unintentional.

## 2016-02-18 LAB — HEPATITIS PANEL, ACUTE
HCV Ab: <0.1 s/co ratio (ref 0.0–0.9)
HEP B C IGM: NEGATIVE
Hep A IgM: NEGATIVE
Hepatitis B Surface Ag: NEGATIVE

## 2016-02-18 LAB — URINE CULTURE: Culture: 50000 — AB

## 2016-02-18 LAB — COMPREHENSIVE METABOLIC PANEL
ALBUMIN: 3.2 g/dL — AB (ref 3.5–5.0)
ALK PHOS: 194 U/L — AB (ref 38–126)
ALT: 381 U/L — AB (ref 17–63)
AST: 213 U/L — AB (ref 15–41)
Anion gap: 5 (ref 5–15)
BILIRUBIN TOTAL: 2.4 mg/dL — AB (ref 0.3–1.2)
BUN: 11 mg/dL (ref 6–20)
CO2: 27 mmol/L (ref 22–32)
CREATININE: 0.97 mg/dL (ref 0.61–1.24)
Calcium: 8.7 mg/dL — ABNORMAL LOW (ref 8.9–10.3)
Chloride: 105 mmol/L (ref 101–111)
GFR calc Af Amer: >60 mL/min (ref >60)
GLUCOSE: 113 mg/dL — AB (ref 65–99)
Potassium: 3.4 mmol/L — ABNORMAL LOW (ref 3.5–5.1)
Sodium: 137 mmol/L (ref 135–145)
TOTAL PROTEIN: 6.7 g/dL (ref 6.5–8.1)

## 2016-02-18 LAB — URINE DRUG SCREEN, QUALITATIVE (ARMC ONLY)
Amphetamines, Ur Screen: NOT DETECTED
BARBITURATES, UR SCREEN: NOT DETECTED
BENZODIAZEPINE, UR SCRN: NOT DETECTED
COCAINE METABOLITE, UR ~~LOC~~: NOT DETECTED
Cannabinoid 50 Ng, Ur ~~LOC~~: NOT DETECTED
MDMA (Ecstasy)Ur Screen: NOT DETECTED
Methadone Scn, Ur: NOT DETECTED
Opiate, Ur Screen: NOT DETECTED
PHENCYCLIDINE (PCP) UR S: NOT DETECTED
Tricyclic, Ur Screen: NOT DETECTED

## 2016-02-18 LAB — CK: Total CK: 105 U/L (ref 49–397)

## 2016-02-18 LAB — ACETAMINOPHEN LEVEL

## 2016-02-18 MED ORDER — POLYETHYLENE GLYCOL 3350 17 G PO PACK
17.0000 g | PACK | Freq: Every day | ORAL | Status: DC
  Administered 2016-02-18 – 2016-02-19 (×2): 17 g via ORAL
  Filled 2016-02-18 (×2): qty 1

## 2016-02-18 MED ORDER — DOCUSATE SODIUM 100 MG PO CAPS
200.0000 mg | ORAL_CAPSULE | Freq: Two times a day (BID) | ORAL | Status: DC
  Administered 2016-02-18 – 2016-02-19 (×2): 200 mg via ORAL
  Filled 2016-02-18 (×2): qty 2

## 2016-02-18 NOTE — Progress Notes (Signed)
[facility]rem[facility] Price at [place] NAME: Jeremiah Price    MR#:  [id number]  DATE OF BIRTH:  1945/06/19  SUBJECTIVE:  CHIEF COMPLAINT:   Chief Complaint  Patient presents with  . Dizziness  . Abdominal Pain  . Chills  Sitting in chair, had temp spike y'day and blood c/s + now  REVIEW OF SYSTEMS:  Review of Systems  Constitutional: Negative for chills, fever and weight loss.  HENT: Negative for nosebleeds and sore throat.   Eyes: Negative for blurred vision.  Respiratory: Negative for cough, shortness of breath and wheezing.   Cardiovascular: Negative for chest pain, orthopnea, leg swelling and PND.  Gastrointestinal: Negative for constipation, diarrhea and heartburn.  Genitourinary: Negative for dysuria and urgency.  Musculoskeletal: Negative for back pain.  Skin: Negative for rash.  Neurological: Negative for dizziness, speech change, focal weakness and headaches.  Endo/Heme/Allergies: Does not bruise/bleed easily.  Psychiatric/Behavioral: Negative for depression.   DRUG ALLERGIES:  No Known Allergies VITALS:  Blood pressure 128/76, pulse 64, temperature 97.6 F (36.4 C), temperature source Oral, resp. rate 16, height 6\' 3"  (1.905 m), weight 102.5 kg (226 lb), SpO2 98 %. PHYSICAL EXAMINATION:  Physical Exam  Constitutional: He is oriented to person, place, and time and well-developed, well-nourished, and in no distress.  HENT:  Head: Normocephalic and atraumatic.  Eyes: Conjunctivae and EOM are normal. Pupils are equal, round, and reactive to light.  Neck: Normal range of motion. Neck supple. No tracheal deviation present. No thyromegaly present.  Cardiovascular: Normal rate, regular rhythm and normal heart sounds.   Pulmonary/Chest: Effort normal and breath sounds normal. No respiratory distress. He has no wheezes. He exhibits no tenderness.  Abdominal: Soft. Bowel sounds are normal. He exhibits no distension. There is no tenderness.    Musculoskeletal: Normal range of motion.  Neurological: He is alert and oriented to person, place, and time. No cranial nerve deficit.  Skin: Skin is warm and dry. No rash noted.  Psychiatric: Mood and affect normal.   LABORATORY PANEL:   CBC  Recent Labs Lab 02/17/16 0648  WBC 10.9*  HGB 13.9  HCT 41.1  PLT 227   ------------------------------------------------------------------------------------------------------------------ Chemistries   Recent Labs Lab 02/17/16 0648  NA 137  K 3.7  CL 104  CO2 27  GLUCOSE 104*  BUN 9  CREATININE 1.03  CALCIUM 8.5*  AST 663*  ALT 615*  ALKPHOS 230*  BILITOT 2.9*   RADIOLOGY:  No results found. ASSESSMENT AND PLAN:  71 y.o. male who presents with Acute onset abdominal pain with nausea and vomiting, dizziness, chills.   * Sepsis: present on admission. Unknown source. - Blood c/s + for E.coli and enterobacteriaceae - GI Source? May need c-scope in couple weeks as an outpt to eval for GI source - continue meropenem  - ID c/s (no coverage over this weekend) tomorrow  * Transaminitis - strong suspicion for possible acute hepatitis. GI consult pending - Pending hepatitis panel - labs pending today - orders Tylenolol level, CK, [place] abd per GI recommendations - Appreciate GI input  * Abdominal pain - much improved as well as his nausea and vomiting. Symptomatic mgmt  * HTN (hypertension) - continue home meds     All the records are reviewed and case discussed with Care Management/Social Worker. Management plans discussed with the patient, family and they are in agreement.  CODE STATUS: full code  TOTAL TIME TAKING CARE OF THIS PATIENT: 35 minutes.   More than  50% of the time was spent in counseling/coordination of care: YES  POSSIBLE D/C IN 1-2 DAYS, DEPENDING ON CLINICAL CONDITION. And GI eval    Max Sane M.D on 02/18/2016 at 9:56 AM  Between 7am to 6pm - Pager - [phone]  After 6pm go to [email or web address] -  [facility]  Office  [phone]  CC: Primary care physician; No primary care provider on file.  Note: This dictation was prepared with Dragon dictation along with smaller phrase technology. Any transcriptional errors that result from this process are unintentional.

## 2016-02-18 NOTE — Progress Notes (Signed)
[name] MD [address] [place], [place] [address] Phone: [phone] Fax : [phone]  Suheb Bermudes is being followed for hepatitis Day 1 of follow up   Subjective: The patient feels better Admission blood cultures grew out E.coli and Enterobacteriaecae  Objective: Vital signs in last 24 hours: Vitals:   02/17/16 1938 02/17/16 2112 02/18/16 0509 02/18/16 0736  BP: 129/62  119/73 128/76  Pulse: 83  65 64  Resp: 16  20 16   Temp: (!) 100.7 F (38.2 C) 99.4 F (37.4 C) 98.4 F (36.9 C) 97.6 F (36.4 C)  TempSrc: Oral Oral Oral Oral  SpO2: 100%  100% 98%  Weight:      Height:       Weight change:   Intake/Output Summary (Last 24 hours) at 02/18/16 1040 Last data filed at 02/18/16 1003  Gross per 24 hour  Intake           2543.5 ml  Output             1975 ml  Net            568.5 ml     Exam: Gen: sitting up in recliner having completed breakfast HEENT: no scleral icterus, dry MM Heart:: Regular rate and rhythm Lungs: no respiratory distress Abdomen: soft, NT, ND Ext: warm without edema Skin: no visible jaundice or rashes Mental Status: alert, answering questions appropriately  Lab Results: @LABTEST2 @ Micro Results: Recent Results (from the past 240 hour(s))  Culture, blood (Routine x 2)     Status: None (Preliminary result)   Collection Time: 02/16/16  8:59 PM  Result Value Ref Range Status   Specimen Description BLOOD L UPPER ARM  Final   Special Requests BOTTLES DRAWN AEROBIC AND ANAEROBIC BCAV  Final   Culture  Setup Time   Final    GRAM NEGATIVE RODS IN BOTH AEROBIC AND ANAEROBIC BOTTLES CRITICAL RESULT CALLED TO, READ BACK BY AND VERIFIED WITH: Darrow Bussing @ 1255 02/17/16 by [facility]    Culture [place]  Final   Report Status PENDING  Incomplete  Blood Culture ID Panel (Reflexed)     Status: Abnormal   Collection Time: 02/16/16  8:59 PM  Result Value Ref Range Status   Enterococcus species NOT DETECTED NOT DETECTED  Final   Listeria monocytogenes NOT DETECTED NOT DETECTED Final   Staphylococcus species NOT DETECTED NOT DETECTED Final   Staphylococcus aureus NOT DETECTED NOT DETECTED Final   Streptococcus species NOT DETECTED NOT DETECTED Final   Streptococcus agalactiae NOT DETECTED NOT DETECTED Final   Streptococcus pneumoniae NOT DETECTED NOT DETECTED Final   Streptococcus pyogenes NOT DETECTED NOT DETECTED Final   Acinetobacter baumannii NOT DETECTED NOT DETECTED Final   Enterobacteriaceae species DETECTED (A) NOT DETECTED Final    Comment: Enterobacteriaceae represent a large family of gram-negative bacteria, not a single organism. CRITICAL RESULT CALLED TO, READ BACK BY AND VERIFIED WITH: Darrow Bussing @ 1255 02/17/16 by [place]    Enterobacter cloacae complex NOT DETECTED NOT DETECTED Final   Escherichia coli DETECTED (A) NOT DETECTED Final    Comment: CRITICAL RESULT CALLED TO, READ BACK BY AND VERIFIED WITH: Darrow Bussing @ 1255 02/17/16 by [place]    Klebsiella oxytoca NOT DETECTED NOT DETECTED Final   Klebsiella pneumoniae NOT DETECTED NOT DETECTED Final   Proteus species NOT DETECTED NOT DETECTED Final   Serratia marcescens NOT DETECTED NOT DETECTED Final   Carbapenem resistance NOT DETECTED NOT DETECTED Final   Haemophilus influenzae  NOT DETECTED NOT DETECTED Final   Neisseria meningitidis NOT DETECTED NOT DETECTED Final   Pseudomonas aeruginosa NOT DETECTED NOT DETECTED Final   Candida albicans NOT DETECTED NOT DETECTED Final   Candida glabrata NOT DETECTED NOT DETECTED Final   Candida krusei NOT DETECTED NOT DETECTED Final   Candida parapsilosis NOT DETECTED NOT DETECTED Final   Candida tropicalis NOT DETECTED NOT DETECTED Final  Culture, blood (Routine x 2)     Status: None (Preliminary result)   Collection Time: 02/16/16 11:04 PM  Result Value Ref Range Status   Specimen Description BLOOD LEFT ANTECUBITAL  Final   Special Requests BOTTLES DRAWN AEROBIC AND ANAEROBIC 5ccaero,5ccana   Final   Culture NO GROWTH 2 DAYS  Final   Report Status PENDING  Incomplete   Studies/Results: Dg Chest 2 View  Result Date: 02/16/2016 CLINICAL DATA:  Dizziness, abdominal pain, and chills since yesterday. EXAM: CHEST  2 VIEW COMPARISON:  10/24/2012 FINDINGS: The heart size and mediastinal contours are within normal limits. Both lungs are clear. The visualized skeletal structures are unremarkable. IMPRESSION: No active cardiopulmonary disease. Electronically Signed   By: Lucienne Capers M.D.   On: 02/16/2016 21:18   Ct Head Wo Contrast  Result Date: 02/16/2016 CLINICAL DATA:  Dizziness, abdominal pain, and chills since yesterday. Nausea. Fever. Tachycardia. EXAM: CT HEAD WITHOUT CONTRAST TECHNIQUE: Contiguous axial images were obtained from the base of the skull through the vertex without intravenous contrast. COMPARISON:  MRI brain 10/26/2012.  CT head 10/24/2012. FINDINGS: Brain: Diffuse cerebral atrophy. Small bilateral chronic subdural hygromas. No mass effect or midline shift. No abnormal extra-axial fluid collections. Gray-white matter junctions are distinct. Basal cisterns are not effaced. No ventricular dilatation. No evidence of acute intracranial hemorrhage. Vascular: No hyperdense vessel or unexpected calcification. Skull: Normal. Negative for fracture or focal lesion. Sinuses/Orbits: No acute finding. Other: No significant changes since prior study. IMPRESSION: No acute intracranial abnormalities. Electronically Signed   By: Lucienne Capers M.D.   On: 02/16/2016 22:25   Ct Abdomen Pelvis W Contrast  Result Date: 02/16/2016 CLINICAL DATA:  Fever and abdominal pain EXAM: CT ABDOMEN AND PELVIS WITH CONTRAST TECHNIQUE: Multidetector CT imaging of the abdomen and pelvis was performed using the standard protocol following bolus administration of intravenous contrast. CONTRAST:  140mL ISOVUE-300 IOPAMIDOL (ISOVUE-300) INJECTION 61% COMPARISON:  CT abdomen pelvis 01/31/2006 FINDINGS: Lower chest:  No pulmonary nodules. No visible pleural or pericardial effusion. There are coronary artery calcifications. Hepatobiliary: Normal hepatic size and contours without focal liver lesion. No perihepatic ascites. No intra- or extrahepatic biliary dilatation. Status post cholecystectomy. Pancreas: Normal pancreatic contours and enhancement. No peripancreatic fluid collection or pancreatic ductal dilatation. Spleen: Normal. Adrenals/Urinary Tract: Normal adrenal glands. No hydronephrosis or solid renal mass. Stomach/Bowel: No abnormal bowel dilatation. No bowel wall thickening or adjacent fat stranding to indicate acute inflammation. No abdominal fluid collection. Normal appendix. Vascular/Lymphatic: There is extensive aortic atherosclerotic calcification and noncalcified plaque. There is an infrarenal abdominal aortic aneurysm measuring 3.2 cm in diameter, previously 2.3 cm. 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 These have increased in size slightly (previously 2.0 cm on the right and 1.5 cm on the left). 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 No abdominal or pelvic adenopathy. Reproductive: Normal prostate and seminal vesicles. No free fluid in  the pelvis. Musculoskeletal: Left lumbosacral assimilation joint. No lytic or blastic lesions. No bony spinal canal stenosis. Normal visualized extrathoracic and extraperitoneal soft tissues. Other: There is a right inguinal hernia containing fat and a small amount of small bowel without evidence of obstruction. IMPRESSION: 1. No acute abnormality of the abdomen or pelvis. 2. 3.2 cm infrarenal abdominal aortic aneurysm, increased from 2.3 cm previously. Recommend followup by ultrasound in 3 years. This recommendation follows ACR consensus guidelines: White Paper of the ACR Incidental  Findings Committee II on Vascular Findings. J Am Coll Radiol 2013; [id number] 3. 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 4. Small right inguinal hernia containing fat and a loop of small bowel without evidence of obstruction or ischemia. 5. Coronary artery and aortic atherosclerosis. Electronically Signed   By: Ulyses Jarred M.D.   On: 02/16/2016 22:34   Medications: I have reviewed the patient's current medications. Scheduled Meds: . enoxaparin (LOVENOX) injection  40 mg Subcutaneous Q24H  . meropenem  1 g Intravenous Q8H  . sodium chloride flush  3 mL Intravenous Q12H   Continuous Infusions: . sodium chloride 75 mL/hr at 02/17/16 1654   PRN Meds:.ibuprofen, ondansetron **OR** ondansetron (ZOFRAN) IV   Assessment: Principal Problem:   Transaminitis Active Problems:   Abdominal pain   HTN (hypertension)   Sepsis [facility])  Mr. Golon is a 71 year old male with a history notable for hypertension, hypothyroidism and vertigo whom I am seeing in consultation for transaminitis. While the patient's hepatitis work up is still pending, it is likely that his acute liver injury is a response to his gram negative bacteremia. Therefore, it should resolve as the bacteremia is treated. It is, however, prudent to complete at least a serologic work up that I recommended yesterday to evaluate acute hepatitis. I do not have LFTs after my evaluation yesterday to compare, but should his LFTs, not improve dramatically over the next couple of days, it will certainly be important to obtain a liver duplex with dopplers.   Plan:  - management of gram negative bactermia - complete serologic work up for acute hepatitis as listed in initial consult note from 02/17/16 - monitor liver function tests daily   LOS: 1 day   Lisbeth Renshaw 02/18/2016, 10:40 AM

## 2016-02-19 ENCOUNTER — Inpatient Hospital Stay: Payer: Medicare Other

## 2016-02-19 DIAGNOSIS — R7989 Other specified abnormal findings of blood chemistry: Secondary | ICD-10-CM

## 2016-02-19 LAB — CBC
HEMATOCRIT: 36.1 % — AB (ref 40.0–52.0)
HEMOGLOBIN: 12.8 g/dL — AB (ref 13.0–18.0)
MCH: 29.6 pg (ref 26.0–34.0)
MCHC: 35.4 g/dL (ref 32.0–36.0)
MCV: 83.5 fL (ref 80.0–100.0)
Platelets: 212 10*3/uL (ref 150–440)
RBC: 4.32 MIL/uL — ABNORMAL LOW (ref 4.40–5.90)
RDW: 14.2 % (ref 11.5–14.5)
WBC: 4.2 10*3/uL (ref 3.8–10.6)

## 2016-02-19 LAB — COMPREHENSIVE METABOLIC PANEL
ALT: 253 U/L — AB (ref 17–63)
AST: 101 U/L — AB (ref 15–41)
Albumin: 3.1 g/dL — ABNORMAL LOW (ref 3.5–5.0)
Alkaline Phosphatase: 186 U/L — ABNORMAL HIGH (ref 38–126)
Anion gap: 6 (ref 5–15)
BUN: 10 mg/dL (ref 6–20)
CHLORIDE: 107 mmol/L (ref 101–111)
CO2: 23 mmol/L (ref 22–32)
CREATININE: 0.91 mg/dL (ref 0.61–1.24)
Calcium: 8.5 mg/dL — ABNORMAL LOW (ref 8.9–10.3)
GFR calc non Af Amer: >60 mL/min (ref >60)
Glucose, Bld: 106 mg/dL — ABNORMAL HIGH (ref 65–99)
POTASSIUM: 3.3 mmol/L — AB (ref 3.5–5.1)
SODIUM: 136 mmol/L (ref 135–145)
Total Bilirubin: 1.5 mg/dL — ABNORMAL HIGH (ref 0.3–1.2)
Total Protein: 6.5 g/dL (ref 6.5–8.1)

## 2016-02-19 MED ORDER — AMOXICILLIN-POT CLAVULANATE 875-125 MG PO TABS: 1.0000 | ORAL_TABLET | Freq: Two times a day (BID) | ORAL | 0 refills | Status: DC

## 2016-02-19 NOTE — Consult Note (Signed)
[facility] Infectious Disease     Reason for Consult: Bacteremia, elevated lfts    Referring Physician: Carlynn Spry Date of Admission:  02/16/2016   Principal Problem:   Transaminitis Active Problems:   Abdominal pain   HTN (hypertension)   Sepsis ([place])   HPI: Jeremiah Price is a 71 y.o. male admitted with acute onset abd pain mv and chills. On admit he had temp 103 wbc 11 and elevated lfts. His abd pain however had subsided. His [place] grew E coli. Has been on zosyn then meropenem and clinically much improved with wbc down to 4 and no fever.  He had no prodromal illness or sick contacts. He has had a cholecystectomy.  NO travel.   Past Medical History:  Diagnosis Date  . Hypertension   . Thyroid disease    Past Surgical History:  Procedure Laterality Date  . CHOLECYSTECTOMY     Social History  Substance Use Topics  . Smoking status: Never Smoker  . Smokeless tobacco: Never Used  . Alcohol use No   Family History  Problem Relation Age of Onset  . CAD Neg Hx     Allergies: No Known Allergies  Current antibiotics: Antibiotics Given (last 72 hours)    Date/Time Action Medication Dose Rate   02/17/16 0912 Given   piperacillin-tazobactam (ZOSYN) IVPB 3.375 g 3.375 g 12.5 mL/hr   02/17/16 0913 Given   vancomycin (VANCOCIN) 1,500 mg in sodium chloride 0.9 % 500 mL IVPB 1,500 mg 250 mL/hr   02/17/16 1508 Given   meropenem (MERREM) IVPB SOLR 1 g 1 g 100 mL/hr   02/18/16 0630 Given  [.]   meropenem (MERREM) IVPB SOLR 1 g 1 g 100 mL/hr   02/18/16 1508 Given   meropenem (MERREM) IVPB SOLR 1 g 1 g 100 mL/hr   02/18/16 2146 Given   meropenem (MERREM) IVPB SOLR 1 g 1 g 100 mL/hr   02/19/16 0616 Given   meropenem (MERREM) IVPB SOLR 1 g 1 g 100 mL/hr      MEDICATIONS: . docusate sodium  200 mg Oral BID  . enoxaparin (LOVENOX) injection  40 mg Subcutaneous Q24H  . meropenem  1 g Intravenous Q8H  . polyethylene glycol  17 g Oral Daily  . sodium chloride flush  3 mL  Intravenous Q12H    Review of Systems - 11 systems reviewed and negative per HPI   OBJECTIVE: Temp:  [98.4 F (36.9 C)-98.6 F (37 C)] 98.6 F (37 C) (02/12 0747) Pulse Rate:  [72-77] 72 (02/12 0747) Resp:  [16-18] 18 (02/12 0747) BP: (131-155)/(82-93) 150/87 (02/12 0747) SpO2:  [98 %-100 %] 99 % (02/12 0747) Physical Exam  Constitutional: He is oriented to person, place, and time. He appears well-developed and well-nourished. No distress.  HENT: anicteric Mouth/Throat: Oropharynx is clear and moist. No oropharyngeal exudate.  Cardiovascular: Normal rate, regular rhythm and normal heart sounds. Exam reveals no gallop and no friction rub.  No murmur heard.  Pulmonary/Chest: Effort normal and breath sounds normal. No respiratory distress. He has no wheezes.  Abdominal: Soft. Bowel sounds are normal. He exhibits no distension. There is no tenderness.  Lymphadenopathy:  He has no cervical adenopathy.  Neurological: He is alert and oriented to person, place, and time.  Skin: Skin is warm and dry. No rash noted. No erythema.  Psychiatric: He has a normal mood and affect. His behavior is normal.     LABS: Results for orders placed or performed during the hospital encounter of 02/16/16 (  from the past 48 hour(s))  Comprehensive metabolic panel     Status: Abnormal   Collection Time: 02/18/16  9:41 AM  Result Value Ref Range   Sodium 137 135 - 145 mmol/L   Potassium 3.4 (L) 3.5 - 5.1 mmol/L   Chloride 105 101 - 111 mmol/L   CO2 27 22 - 32 mmol/L   Glucose, Bld 113 (H) 65 - 99 mg/dL   BUN 11 6 - 20 mg/dL   Creatinine, Ser 0.97 0.61 - 1.24 mg/dL   Calcium 8.7 (L) 8.9 - 10.3 mg/dL   Total Protein 6.7 6.5 - 8.1 g/dL   Albumin 3.2 (L) 3.5 - 5.0 g/dL   AST 213 (H) 15 - 41 U/L   ALT 381 (H) 17 - 63 U/L   Alkaline Phosphatase 194 (H) 38 - 126 U/L   Total Bilirubin 2.4 (H) 0.3 - 1.2 mg/dL   GFR calc non Af Amer >60 >60 mL/min   GFR calc Af Amer >60 >60 mL/min    Comment: (NOTE) The  eGFR has been calculated using the CKD EPI equation. This calculation has not been validated in all clinical situations. eGFR's persistently <60 mL/min signify possible Chronic Kidney Disease.    Anion gap 5 5 - 15  Acetaminophen level     Status: Abnormal   Collection Time: 02/18/16  9:41 AM  Result Value Ref Range   Acetaminophen (Tylenol), Serum <10 (L) 10 - 30 ug/mL    Comment:        THERAPEUTIC CONCENTRATIONS VARY SIGNIFICANTLY. A RANGE OF 10-30 ug/mL MAY BE AN EFFECTIVE CONCENTRATION FOR MANY PATIENTS. HOWEVER, SOME ARE BEST TREATED AT CONCENTRATIONS OUTSIDE THIS RANGE. ACETAMINOPHEN CONCENTRATIONS >150 ug/mL AT 4 HOURS AFTER INGESTION AND >50 ug/mL AT 12 HOURS AFTER INGESTION ARE OFTEN ASSOCIATED WITH TOXIC REACTIONS.   CK     Status: None   Collection Time: 02/18/16  9:41 AM  Result Value Ref Range   Total CK 105 49 - 397 U/L  Urine Drug Screen, Qualitative ([facility] only)     Status: None   Collection Time: 02/18/16  9:54 AM  Result Value Ref Range   Tricyclic, Ur Screen NONE DETECTED NONE DETECTED   Amphetamines, Ur Screen NONE DETECTED NONE DETECTED   MDMA (Ecstasy)Ur Screen NONE DETECTED NONE DETECTED   Cocaine Metabolite,Ur [place] NONE DETECTED NONE DETECTED   Opiate, Ur Screen NONE DETECTED NONE DETECTED   Phencyclidine (PCP) Ur S NONE DETECTED NONE DETECTED   Cannabinoid 50 Ng, Ur [place] NONE DETECTED NONE DETECTED   Barbiturates, Ur Screen NONE DETECTED NONE DETECTED   Benzodiazepine, Ur Scrn NONE DETECTED NONE DETECTED   Methadone Scn, Ur NONE DETECTED NONE DETECTED    Comment: (NOTE) 341  Tricyclics, urine               Cutoff 1000 ng/mL 200  Amphetamines, urine             Cutoff 1000 ng/mL 300  MDMA (Ecstasy), urine           Cutoff 500 ng/mL 400  Cocaine Metabolite, urine       Cutoff 300 ng/mL 500  Opiate, urine                   Cutoff 300 ng/mL 600  Phencyclidine (PCP), urine      Cutoff 25 ng/mL 700  Cannabinoid, urine              Cutoff 50 ng/mL 800   Barbiturates, urine  Cutoff 200 ng/mL 900  Benzodiazepine, urine           Cutoff 200 ng/mL 1000 Methadone, urine                Cutoff 300 ng/mL 1100 1200 The urine drug screen provides only a preliminary, unconfirmed 1300 analytical test result and should not be used for non-medical 1400 purposes. Clinical consideration and professional judgment should 1500 be applied to any positive drug screen result due to possible 1600 interfering substances. A more specific alternate chemical method 1700 must be used in order to obtain a confirmed analytical result.  1800 Gas chromato graphy / mass spectrometry (GC/MS) is the preferred 1900 confirmatory method.   CBC     Status: Abnormal   Collection Time: 02/19/16  5:03 AM  Result Value Ref Range   WBC 4.2 3.8 - 10.6 K/uL   RBC 4.32 (L) 4.40 - 5.90 MIL/uL   Hemoglobin 12.8 (L) 13.0 - 18.0 g/dL   HCT 36.1 (L) 40.0 - 52.0 %   MCV 83.5 80.0 - 100.0 fL   MCH 29.6 26.0 - 34.0 pg   MCHC 35.4 32.0 - 36.0 g/dL   RDW 14.2 11.5 - 14.5 %   Platelets 212 150 - 440 K/uL  Comprehensive metabolic panel     Status: Abnormal   Collection Time: 02/19/16  5:03 AM  Result Value Ref Range   Sodium 136 135 - 145 mmol/L   Potassium 3.3 (L) 3.5 - 5.1 mmol/L   Chloride 107 101 - 111 mmol/L   CO2 23 22 - 32 mmol/L   Glucose, Bld 106 (H) 65 - 99 mg/dL   BUN 10 6 - 20 mg/dL   Creatinine, Ser 0.91 0.61 - 1.24 mg/dL   Calcium 8.5 (L) 8.9 - 10.3 mg/dL   Total Protein 6.5 6.5 - 8.1 g/dL   Albumin 3.1 (L) 3.5 - 5.0 g/dL   AST 101 (H) 15 - 41 U/L   ALT 253 (H) 17 - 63 U/L   Alkaline Phosphatase 186 (H) 38 - 126 U/L   Total Bilirubin 1.5 (H) 0.3 - 1.2 mg/dL   GFR calc non Af Amer >60 >60 mL/min   GFR calc Af Amer >60 >60 mL/min    Comment: (NOTE) The eGFR has been calculated using the CKD EPI equation. This calculation has not been validated in all clinical situations. eGFR's persistently <60 mL/min signify possible Chronic Kidney Disease.    Anion  gap 6 5 - 15   No components found for: ESR, C REACTIVE PROTEIN MICRO: Recent Results (from the past 720 hour(s))  Culture, blood (Routine x 2)     Status: Abnormal (Preliminary result)   Collection Time: 02/16/16  8:59 PM  Result Value Ref Range Status   Specimen Description BLOOD L UPPER ARM  Final   Special Requests BOTTLES DRAWN AEROBIC AND ANAEROBIC BCAV  Final   Culture  Setup Time   Final    GRAM NEGATIVE RODS IN BOTH AEROBIC AND ANAEROBIC BOTTLES CRITICAL RESULT CALLED TO, READ BACK BY AND VERIFIED WITH: Darrow Bussing @ 1255 02/17/16 by [facility]    Culture (A)  Final    ESCHERICHIA COLI SUSCEPTIBILITIES TO FOLLOW Performed at [facility], [place] [address], [place], [place] [address]    Report Status PENDING  Incomplete  Blood Culture ID Panel (Reflexed)     Status: Abnormal   Collection Time: 02/16/16  8:59 PM  Result Value Ref Range Status   Enterococcus species NOT  DETECTED NOT DETECTED Final   Listeria monocytogenes NOT DETECTED NOT DETECTED Final   Staphylococcus species NOT DETECTED NOT DETECTED Final   Staphylococcus aureus NOT DETECTED NOT DETECTED Final   Streptococcus species NOT DETECTED NOT DETECTED Final   Streptococcus agalactiae NOT DETECTED NOT DETECTED Final   Streptococcus pneumoniae NOT DETECTED NOT DETECTED Final   Streptococcus pyogenes NOT DETECTED NOT DETECTED Final   Acinetobacter baumannii NOT DETECTED NOT DETECTED Final   Enterobacteriaceae species DETECTED (A) NOT DETECTED Final    Comment: Enterobacteriaceae represent a large family of gram-negative bacteria, not a single organism. CRITICAL RESULT CALLED TO, READ BACK BY AND VERIFIED WITH: Darrow Bussing @ 1255 02/17/16 by [place]    Enterobacter cloacae complex NOT DETECTED NOT DETECTED Final   Escherichia coli DETECTED (A) NOT DETECTED Final    Comment: CRITICAL RESULT CALLED TO, READ BACK BY AND VERIFIED WITH: Darrow Bussing @ 1255 02/17/16 by [place]    Klebsiella oxytoca NOT DETECTED NOT DETECTED  Final   Klebsiella pneumoniae NOT DETECTED NOT DETECTED Final   Proteus species NOT DETECTED NOT DETECTED Final   Serratia marcescens NOT DETECTED NOT DETECTED Final   Carbapenem resistance NOT DETECTED NOT DETECTED Final   Haemophilus influenzae NOT DETECTED NOT DETECTED Final   Neisseria meningitidis NOT DETECTED NOT DETECTED Final   Pseudomonas aeruginosa NOT DETECTED NOT DETECTED Final   Candida albicans NOT DETECTED NOT DETECTED Final   Candida glabrata NOT DETECTED NOT DETECTED Final   Candida krusei NOT DETECTED NOT DETECTED Final   Candida parapsilosis NOT DETECTED NOT DETECTED Final   Candida tropicalis NOT DETECTED NOT DETECTED Final  Culture, blood (Routine x 2)     Status: None (Preliminary result)   Collection Time: 02/16/16 11:04 PM  Result Value Ref Range Status   Specimen Description BLOOD LEFT ANTECUBITAL  Final   Special Requests BOTTLES DRAWN AEROBIC AND ANAEROBIC 5ccaero,5ccana  Final   Culture NO GROWTH 3 DAYS  Final   Report Status PENDING  Incomplete  Urine culture     Status: Abnormal   Collection Time: 02/16/16 11:04 PM  Result Value Ref Range Status   Specimen Description URINE, RANDOM  Final   Special Requests NONE  Final   Culture (A)  Final    50,000 COLONIES/mL DIPHTHEROIDS(CORYNEBACTERIUM SPECIES) Standardized susceptibility testing for this organism is not available. Performed at [facility], [place] [address], [place], [place] [address]    Report Status 02/18/2016 FINAL  Final    IMAGING: Dg Chest 2 View  Result Date: 02/16/2016 CLINICAL DATA:  Dizziness, abdominal pain, and chills since yesterday. EXAM: CHEST  2 VIEW COMPARISON:  10/24/2012 FINDINGS: The heart size and mediastinal contours are within normal limits. Both lungs are clear. The visualized skeletal structures are unremarkable. IMPRESSION: No active cardiopulmonary disease. Electronically Signed   By: Lucienne Capers M.D.   On: 02/16/2016 21:18   Ct Head Wo Contrast  Result  Date: 02/16/2016 CLINICAL DATA:  Dizziness, abdominal pain, and chills since yesterday. Nausea. Fever. Tachycardia. EXAM: CT HEAD WITHOUT CONTRAST TECHNIQUE: Contiguous axial images were obtained from the base of the skull through the vertex without intravenous contrast. COMPARISON:  MRI brain 10/26/2012.  CT head 10/24/2012. FINDINGS: Brain: Diffuse cerebral atrophy. Small bilateral chronic subdural hygromas. No mass effect or midline shift. No abnormal extra-axial fluid collections. Gray-white matter junctions are distinct. Basal cisterns are not effaced. No ventricular dilatation. No evidence of acute intracranial hemorrhage. Vascular: No hyperdense vessel or unexpected calcification. Skull: Normal. Negative for  fracture or focal lesion. Sinuses/Orbits: No acute finding. Other: No significant changes since prior study. IMPRESSION: No acute intracranial abnormalities. Electronically Signed   By: Lucienne Capers M.D.   On: 02/16/2016 22:25   Ct Abdomen Pelvis W Contrast  Result Date: 02/16/2016 CLINICAL DATA:  Fever and abdominal pain EXAM: CT ABDOMEN AND PELVIS WITH CONTRAST TECHNIQUE: Multidetector CT imaging of the abdomen and pelvis was performed using the standard protocol following bolus administration of intravenous contrast. CONTRAST:  153m ISOVUE-300 IOPAMIDOL (ISOVUE-300) INJECTION 61% COMPARISON:  CT abdomen pelvis 01/31/2006 FINDINGS: Lower chest: No pulmonary nodules. No visible pleural or pericardial effusion. There are coronary artery calcifications. Hepatobiliary: Normal hepatic size and contours without focal liver lesion. No perihepatic ascites. No intra- or extrahepatic biliary dilatation. Status post cholecystectomy. Pancreas: Normal pancreatic contours and enhancement. No peripancreatic fluid collection or pancreatic ductal dilatation. Spleen: Normal. Adrenals/Urinary Tract: Normal adrenal glands. No hydronephrosis or solid renal mass. Stomach/Bowel: No abnormal bowel dilatation. No bowel  wall thickening or adjacent fat stranding to indicate acute inflammation. No abdominal fluid collection. Normal appendix. Vascular/Lymphatic: There is extensive aortic atherosclerotic calcification and noncalcified plaque. There is an infrarenal abdominal aortic aneurysm measuring 3.2 cm in diameter, previously 2.3 cm. 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 These have increased in size slightly (previously 2.0 cm on the right and 1.5 cm on the left). 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 No abdominal or pelvic adenopathy. Reproductive: Normal prostate and seminal vesicles. No free fluid in the pelvis. Musculoskeletal: Left lumbosacral assimilation joint. No lytic or blastic lesions. No bony spinal canal stenosis. Normal visualized extrathoracic and extraperitoneal soft tissues. Other: There is a right inguinal hernia containing fat and a small amount of small bowel without evidence of obstruction. IMPRESSION: 1. No acute abnormality of the abdomen or pelvis. 2. 3.2 cm infrarenal abdominal aortic aneurysm, increased from 2.3 cm previously. Recommend followup by ultrasound in 3 years. This recommendation follows ACR consensus guidelines: White Paper of the ACR Incidental Findings Committee II on Vascular Findings. J Am Coll Radiol 2013; 144:818-5633. 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 4. Small right inguinal hernia containing fat and a loop of small bowel without evidence of obstruction or ischemia. 5. Coronary artery and aortic atherosclerosis. Electronically Signed   By: KUlyses JarredM.D.   On: 02/16/2016 22:34   [place] Flow Abd Pelv Doppler  Result Date: 02/19/2016 CLINICAL DATA:  Elevated liver enzymes EXAM: DUPLEX ULTRASOUND OF LIVER ; ultrasound  right upper quadrant TECHNIQUE: Color and duplex Doppler ultrasound was performed to evaluate the hepatic in-flow and out-flow vessels. Ultrasound right upper quadrant also obtained. COMPARISON:  None. FINDINGS: Portal Vein Velocities Main: 31.8 cm/sec proximally ; 32.6 centimeter/second mid ; 43.7 centimeter/second distally Right:  25.1 cm/sec Left:  23.2 cm/sec Flow is in the anatomic direction for these vessels. Hepatic Vein Velocities Right:  24.3 cm/sec Middle:  25.8 cm/sec Left:  35.8 cm/sec Flow in these vessels is in the anatomic direction. Hepatic Artery Velocity:  108.2 Cm/sec Splenic Vein Velocity:  16.7 cm/sec Varices: None Ascites: None Spleen measures 5.5 x 9.0 x 4.0 cm without focal splenic lesion evident. No portal or hepatic vein occlusion of thrombus. IMPRESSION: Portal, hepatic, and splenic veins are patent with  flow respective anatomic directions. Hepatic artery patent with normal waveform. No thrombus, ascites, or varices evident by ultrasound. Ultrasound right upper quadrant: Gallbladder:  Gallbladder is absent. Common bile duct: 5 mm diameter. No intrahepatic or extrahepatic biliary duct dilatation. Liver: No focal liver lesions evident. Note that the echogenicity of the liver is overall increased. IMPRESSION: Gallbladder absent. Increased liver echogenicity. While no focal liver lesions are evident, it must be cautioned that the sensitivity of ultrasound for detection of focal liver lesions is diminished in this circumstance. Electronically Signed   By: Lowella Grip III M.D.   On: 02/19/2016 09:41   US Abdomen Limited Ruq  Result Date: 02/19/2016 CLINICAL DATA:  Elevated liver enzymes EXAM: DUPLEX ULTRASOUND OF LIVER ; ultrasound right upper quadrant TECHNIQUE: Color and duplex Doppler ultrasound was performed to evaluate the hepatic in-flow and out-flow vessels. Ultrasound right upper quadrant also obtained. COMPARISON:  None. FINDINGS: Portal Vein Velocities Main: 31.8 cm/sec  proximally ; 32.6 centimeter/second mid ; 43.7 centimeter/second distally Right:  25.1 cm/sec Left:  23.2 cm/sec Flow is in the anatomic direction for these vessels. Hepatic Vein Velocities Right:  24.3 cm/sec Middle:  25.8 cm/sec Left:  35.8 cm/sec Flow in these vessels is in the anatomic direction. Hepatic Artery Velocity:  108.2 Cm/sec Splenic Vein Velocity:  16.7 cm/sec Varices: None Ascites: None Spleen measures 5.5 x 9.0 x 4.0 cm without focal splenic lesion evident. No portal or hepatic vein occlusion of thrombus. IMPRESSION: Portal, hepatic, and splenic veins are patent with flow respective anatomic directions. Hepatic artery patent with normal waveform. No thrombus, ascites, or varices evident by ultrasound. Ultrasound right upper quadrant: Gallbladder:  Gallbladder is absent. Common bile duct: 5 mm diameter. No intrahepatic or extrahepatic biliary duct dilatation. Liver: No focal liver lesions evident. Note that the echogenicity of the liver is overall increased. IMPRESSION: Gallbladder absent. Increased liver echogenicity. While no focal liver lesions are evident, it must be cautioned that the sensitivity of ultrasound for detection of focal liver lesions is diminished in this circumstance. Electronically Signed   By: Lowella Grip III M.D.   On: 02/19/2016 09:41    Assessment:   Jeremiah Price is a 71 y.o. male admitted with acute abd pain, elevated lfts and fevers. BCX + E coli, CT scan neg. UA negative. He has no gallbladder, no hx colitis. Clincially much improved with wbc nml, no fevers and lfts improving. Hep serologies negative.  Unclear source of E coli bacteremia but I would suspect a biliary source or colon translocation.   Recommendations If stable for dc can go today with close followup Augmentin 875 mg bid for 14 days total abx course Would suggest otpt GI fu and colonoscopy. Will need to follow LFTs as otpt as well I can see in 1-2 weeks Thank you very much for allowing me  to participate in the care of this patient. Please call with questions.   Cheral Marker. Ola Spurr, MD

## 2016-02-19 NOTE — Care Management Important Message (Signed)
Important Message  Patient Details  Name: Jeremiah Price MRN: [id number] Date of Birth: 11/25/1945   Medicare Important Message Given:  N/A - LOS <3 / Initial given by admissions    Beverly Sessions, RN 02/19/2016, 1:52 PM

## 2016-02-19 NOTE — Progress Notes (Signed)
Patient A&O, VSS.  No complaints of pain.  Ambulating independently in room.  Discharge instructions including medications reviewed with patient and wife.  Understanding was verbalized and all questions were answered.  Patient discharged home via wheelchair in stable condition escorted by nursing staff.

## 2016-02-19 NOTE — Progress Notes (Signed)
Jeremiah Bellows MD [address] [place], [place] [address] Phone: [phone] Fax : [phone]  Jeremiah Price is being followed for acute hepatitis  Day 2 of follow up   Subjective: Feels well, noconfusion, eating well. Says he is ready to go home.    Objective: Vital signs in last 24 hours: Vitals:   02/18/16 1506 02/18/16 2035 02/19/16 0503 02/19/16 0747  BP: 131/83 140/82 (!) 155/93 (!) 150/87  Pulse: 77 74 73 72  Resp: 17 18 16 18   Temp: 98.4 F (36.9 C) 98.4 F (36.9 C) 98.6 F (37 C) 98.6 F (37 C)  TempSrc: Oral Oral Oral Oral  SpO2: 100% 100% 98% 99%  Weight:      Height:       Weight change:   Intake/Output Summary (Last 24 hours) at 02/19/16 [id number] Last data filed at 02/19/16 [id number]  Gross per 24 hour  Intake          2778.25 ml  Output             2600 ml  Net           178.25 ml     Exam: Heart:: Regular rate and rhythm, S1S2 present or without murmur or extra heart sounds Lungs: normal, clear to auscultation and clear to auscultation and percussion Abdomen: soft, nontender, normal bowel sounds   Lab Results: Acute hepatitis panel, tylenol level negative Micro Results: Recent Results (from the past 240 hour(s))  Culture, blood (Routine x 2)     Status: None (Preliminary result)   Collection Time: 02/16/16  8:59 PM  Result Value Ref Range Status   Specimen Description BLOOD L UPPER ARM  Final   Special Requests BOTTLES DRAWN AEROBIC AND ANAEROBIC BCAV  Final   Culture  Setup Time   Final    GRAM NEGATIVE RODS IN BOTH AEROBIC AND ANAEROBIC BOTTLES CRITICAL RESULT CALLED TO, READ BACK BY AND VERIFIED WITH: Darrow Bussing @ 1255 02/17/16 by [facility]    Culture   Final    GRAM NEGATIVE RODS CULTURE REINCUBATED FOR BETTER GROWTH Performed at [facility], [place] [address], [place], [place] [address]    Report Status PENDING  Incomplete  Blood Culture ID Panel (Reflexed)     Status: Abnormal   Collection Time: 02/16/16  8:59 PM  Result  Value Ref Range Status   Enterococcus species NOT DETECTED NOT DETECTED Final   Listeria monocytogenes NOT DETECTED NOT DETECTED Final   Staphylococcus species NOT DETECTED NOT DETECTED Final   Staphylococcus aureus NOT DETECTED NOT DETECTED Final   Streptococcus species NOT DETECTED NOT DETECTED Final   Streptococcus agalactiae NOT DETECTED NOT DETECTED Final   Streptococcus pneumoniae NOT DETECTED NOT DETECTED Final   Streptococcus pyogenes NOT DETECTED NOT DETECTED Final   Acinetobacter baumannii NOT DETECTED NOT DETECTED Final   Enterobacteriaceae species DETECTED (A) NOT DETECTED Final    Comment: Enterobacteriaceae represent a large family of gram-negative bacteria, not a single organism. CRITICAL RESULT CALLED TO, READ BACK BY AND VERIFIED WITH: Darrow Bussing @ 1255 02/17/16 by [place]    Enterobacter cloacae complex NOT DETECTED NOT DETECTED Final   Escherichia coli DETECTED (A) NOT DETECTED Final    Comment: CRITICAL RESULT CALLED TO, READ BACK BY AND VERIFIED WITH: Darrow Bussing @ 1255 02/17/16 by [place]    Klebsiella oxytoca NOT DETECTED NOT DETECTED Final   Klebsiella pneumoniae NOT DETECTED NOT DETECTED Final   Proteus species NOT DETECTED NOT DETECTED Final  Serratia marcescens NOT DETECTED NOT DETECTED Final   Carbapenem resistance NOT DETECTED NOT DETECTED Final   Haemophilus influenzae NOT DETECTED NOT DETECTED Final   Neisseria meningitidis NOT DETECTED NOT DETECTED Final   Pseudomonas aeruginosa NOT DETECTED NOT DETECTED Final   Candida albicans NOT DETECTED NOT DETECTED Final   Candida glabrata NOT DETECTED NOT DETECTED Final   Candida krusei NOT DETECTED NOT DETECTED Final   Candida parapsilosis NOT DETECTED NOT DETECTED Final   Candida tropicalis NOT DETECTED NOT DETECTED Final  Culture, blood (Routine x 2)     Status: None (Preliminary result)   Collection Time: 02/16/16 11:04 PM  Result Value Ref Range Status   Specimen Description BLOOD LEFT ANTECUBITAL  Final    Special Requests BOTTLES DRAWN AEROBIC AND ANAEROBIC 5ccaero,5ccana  Final   Culture NO GROWTH 3 DAYS  Final   Report Status PENDING  Incomplete  Urine culture     Status: Abnormal   Collection Time: 02/16/16 11:04 PM  Result Value Ref Range Status   Specimen Description URINE, RANDOM  Final   Special Requests NONE  Final   Culture (A)  Final    50,000 COLONIES/mL DIPHTHEROIDS(CORYNEBACTERIUM SPECIES) Standardized susceptibility testing for this organism is not available. Performed at [facility], [place] [address] [place], [place] [address]    Report Status 02/18/2016 FINAL  Final   Studies/Results: No results found. Medications: I have reviewed the patient's current medications. Scheduled Meds: . docusate sodium  200 mg Oral BID  . enoxaparin (LOVENOX) injection  40 mg Subcutaneous Q24H  . meropenem  1 g Intravenous Q8H  . polyethylene glycol  17 g Oral Daily  . sodium chloride flush  3 mL Intravenous Q12H   Continuous Infusions: . sodium chloride 75 mL/hr at 02/19/16 0143   PRN Meds:.ibuprofen, ondansetron **OR** ondansetron (ZOFRAN) IV   Assessment: Principal Problem:   Transaminitis Active Problems:   Abdominal pain   HTN (hypertension)   Sepsis ([place])  Jeremiah Price 71 y.o. male admitted with gram negative bacteremia , GI consulted for transaminates which is rapidly improving. Acute hepatitis panel, drug screen  and acetaminophen levels are normal. RUQ USG and dopplers are negative . It is very likely that based on his rapid resolution of the LFT's that it is likely from Sepsis/shocked liver .   Plan: 1. Follow up USG liver 2. F/u Hep B/C viral loads and IGM for hepatitis A to rule out acute infection  3. Advance diet - can be discharged from GI point of view with repeat LFT's,INR in 3-5 days time and follow up with GI .   I will sign off.  Please call me if any further GI concerns or questions.  We would like to thank you for the opportunity to participate  in the care of Jeremiah Price.     LOS: 2 days   Jeremiah Price 02/19/2016, 8:32 AM

## 2016-02-19 NOTE — Discharge Instructions (Signed)
Bacteremia Introduction Bacteremia is the presence of bacteria in the blood. A small amount of bacteria may not cause any symptoms. Sometimes, the bacteria spread and cause infection in other parts of the body, such as the heart, joints, bones, or brain. Having a great amount of bacteria can cause a serious, sometimes life-threatening infection called sepsis. What are the causes? This condition is caused by bacteria that get into the blood. Bacteria can enter the blood:  During a dental or medical procedure.  After you brush your teeth so hard that the gums bleed.  Through a scrape or cut on your skin. More severe types of bacteremia can be caused by:  A bacterial infection, such as pneumonia, that spreads to the blood.  Using a dirty needle. What increases the risk? This condition is more likely to develop in:  Children and elderly adults.  People who have a long-lasting (chronic) disease or medical condition.  People who have an artificial joint or heart valve.  People who have heart valve disease.  People who have a tube, such as a catheter or IV tube, that has been inserted for a medical treatment.  People who have a weak body defense system (immune system).  People who use IV drugs. What are the signs or symptoms? Usually, this condition does not cause symptoms when it is mild. When it is more serious, it may cause:  Fever.  Chills.  Racing heart.  Shortness of breath.  Dizziness.  Weakness.  Confusion.  Nausea or vomiting.  Diarrhea. Bacteremia that has spread to other parts of the body may cause symptoms in those areas. How is this diagnosed? This condition may be diagnosed with a physical exam and tests, such as:  A complete blood count (CBC). This test looks for signs of infection.  Blood cultures. These look for bacteria in your blood.  Tests of any IV tubes. These look for a source of infection.  Urine tests.  Imaging tests, such as an  X-ray, CT scan, MRI, or heart ultrasound. How is this treated? If the condition is mild, treatment is usually not needed. Usually, the bodys immune system will remove the bacteria. If the condition is more serious, it may be treated with:  Antibiotic medicines through an IV tube. These may be given for about 2 weeks. At first, the antibiotic that is given may kill most types of blood bacteria. If your test results show that a certain kind of bacteria is causing problems, the antibiotic may be changed to kill only the bacteria that are causing problems.  Antibiotics taken by mouth.  Removing any catheter or IV tube that is a source of infection.  Blood pressure and breathing support, if needed.  Surgery to control the source or spread of infection, if needed. Follow these instructions at home:  Take over-the-counter and prescription medicines only as told by your health care provider.  If you were prescribed an antibiotic, take it as told by your health care provider. Do not stop taking the antibiotic even if you start to feel better.  Rest at home until your condition is under control.  Drink enough fluid to keep your urine clear or pale yellow.  Keep all follow-up visits as told by your health care provider. This is important. How is this prevented? Take these actions to help prevent future episodes of bacteremia:  Get all vaccinations as recommended by your health care provider.  Clean and cover scrapes or cuts.  Bathe regularly.  Wash your  hands often.  Before any dental or surgical procedure, ask your health care provider if you should take an antibiotic. Contact a health care provider if:  Your symptoms get worse.  You continue to have symptoms after treatment.  You develop new symptoms after treatment. Get help right away if:  You have chest pain or trouble breathing.  You develop confusion, dizziness, or weakness.  You develop pale skin. This information is  not intended to replace advice given to you by your health care provider. Make sure you discuss any questions you have with your health care provider. Document Released: 10/07/2005 Document Revised: 07/14/2015 Document Reviewed: 02/26/2014  2017 Elsevier

## 2016-02-20 ENCOUNTER — Telehealth: Payer: Self-pay | Admitting: Infectious Diseases

## 2016-02-20 LAB — CULTURE, BLOOD (ROUTINE X 2)

## 2016-02-20 LAB — HEPATITIS A ANTIBODY, IGM: Hep A IgM: NEGATIVE

## 2016-02-20 LAB — HEPATITIS B DNA, ULTRAQUANTITATIVE, PCR
HBV DNA SERPL PCR-ACNC: NOT DETECTED IU/mL
HBV DNA SERPL PCR-LOG IU: UNDETERMINED {Log_IU}/mL

## 2016-02-20 MED ORDER — SULFAMETHOXAZOLE-TRIMETHOPRIM 800-160 MG PO TABS: 1.0000 | ORAL_TABLET | Freq: Two times a day (BID) | ORAL | 0 refills | Status: AC

## 2016-02-20 NOTE — Discharge Summary (Signed)
[facility] [place] [facility] NAME: Jeremiah Price    MR#:  [id number]  DATE OF BIRTH:  11-12-1945  DATE OF ADMISSION:  02/16/2016   ADMITTING PHYSICIAN: Lance Coon, MD  DATE OF DISCHARGE: 02/19/2016  3:34 PM  PRIMARY CARE PHYSICIAN: Leonel Ramsay, MD   ADMISSION DIAGNOSIS:  Hepatitis [K75.9] Fever, unspecified fever cause [R50.9] DISCHARGE DIAGNOSIS:  Principal Problem:   Transaminitis Active Problems:   Abdominal pain   HTN (hypertension)   Sepsis ([place])  SECONDARY DIAGNOSIS:   Past Medical History:  Diagnosis Date  . Hypertension   . Thyroid disease    HOSPITAL COURSE:  71 y.o.malewho presents with Acute onset abdominal pain with nausea and vomiting, dizziness, chills.   * Sepsis: present on admission. Unknown source. - E. coli bacteremia likely due to GI source -Recommend outpatient GI follow-up and possible colonoscopy -Initially patient was discharged on oral Augmentin twice a day for 14 days, but later was found to have resistance to Augmentin and E. coli culture sensitivity so he was switched over to Bactrim double strength for 10 days by Dr. Ola Spurr post discharge  * Transaminitis -likely due to sepsis, improved by discharge  *Abdominal pain - much improved as well as his nausea and vomiting.  Resolved likely due to E. coli bacteremia DISCHARGE CONDITIONS:  Stable CONSULTS OBTAINED:  Treatment Team:  Leonel Ramsay, MD DRUG ALLERGIES:  No Known Allergies DISCHARGE MEDICATIONS:   Allergies as of 02/19/2016   No Known Allergies     Medication List    TAKE these medications   amoxicillin-clavulanate 875-125 MG tablet Commonly known as:  AUGMENTIN Take 1 tablet by mouth 2 (two) times daily.   bisoprolol-hydrochlorothiazide 5-6.25 MG tablet Commonly known as:  ZIAC Take 1 tablet by mouth daily.   levothyroxine 50 MCG tablet Commonly known as:  SYNTHROID, LEVOTHROID Take by mouth.        DISCHARGE INSTRUCTIONS:   DIET:  Regular diet DISCHARGE CONDITION:  Good ACTIVITY:  Activity as tolerated OXYGEN:  Home Oxygen: No.  Oxygen Delivery: room air DISCHARGE LOCATION:  home   If you experience worsening of your admission symptoms, develop shortness of breath, life threatening emergency, suicidal or homicidal thoughts you must seek medical attention immediately by calling 911 or calling your MD immediately  if symptoms less severe.  You Must read complete instructions/literature along with all the possible adverse reactions/side effects for all the Medicines you take and that have been prescribed to you. Take any new Medicines after you have completely understood and accpet all the possible adverse reactions/side effects.   Please note  You were cared for by a hospitalist during your hospital stay. If you have any questions about your discharge medications or the care you received while you were in the hospital after you are discharged, you can call the unit and asked to speak with the hospitalist on call if the hospitalist that took care of you is not available. Once you are discharged, your primary care physician will handle any further medical issues. Please note that NO REFILLS for any discharge medications will be authorized once you are discharged, as it is imperative that you return to your primary care physician (or establish a relationship with a primary care physician if you do not have one) for your aftercare needs so that they can reassess your need for medications and monitor your lab values.    On the  day of Discharge:  VITAL SIGNS:  Blood pressure (!) 150/87, pulse 72, temperature 98.6 F (37 C), temperature source Oral, resp. rate 18, height 6\' 3"  (1.905 m), weight 102.5 kg (226 lb), SpO2 99 %. PHYSICAL EXAMINATION:  GENERAL:  71 y.o.-year-old patient lying in the bed with no acute distress.  EYES: Pupils equal, round, reactive to light and  accommodation. No scleral icterus. Extraocular muscles intact.  HEENT: Head atraumatic, normocephalic. Oropharynx and nasopharynx clear.  NECK:  Supple, no jugular venous distention. No thyroid enlargement, no tenderness.  LUNGS: Normal breath sounds bilaterally, no wheezing, rales,rhonchi or crepitation. No use of accessory muscles of respiration.  CARDIOVASCULAR: S1, S2 normal. [name], rubs, or gallops.  ABDOMEN: Soft, non-tender, non-distended. Bowel sounds present. No organomegaly or mass.  EXTREMITIES: No pedal edema, cyanosis, or clubbing.  NEUROLOGIC: Cranial nerves II through XII are intact. Muscle strength 5/5 in all extremities. Sensation intact. Gait not checked.  PSYCHIATRIC: The patient is alert and oriented x 3.  SKIN: No obvious rash, lesion, or ulcer.  DATA REVIEW:   CBC  Recent Labs Lab 02/19/16 0503  WBC 4.2  HGB 12.8*  HCT 36.1*  PLT 212    Chemistries   Recent Labs Lab 02/19/16 0503  NA 136  K 3.3*  CL 107  CO2 23  GLUCOSE 106*  BUN 10  CREATININE 0.91  CALCIUM 8.5*  AST 101*  ALT 253*  ALKPHOS 186*  BILITOT 1.5*     Microbiology Results  Results for orders placed or performed during the hospital encounter of 02/16/16  Culture, blood (Routine x 2)     Status: Abnormal   Collection Time: 02/16/16  8:59 PM  Result Value Ref Range Status   Specimen Description BLOOD L UPPER ARM  Final   Special Requests BOTTLES DRAWN AEROBIC AND ANAEROBIC BCAV  Final   Culture  Setup Time   Final    GRAM NEGATIVE RODS IN BOTH AEROBIC AND ANAEROBIC BOTTLES CRITICAL RESULT CALLED TO, READ BACK BY AND VERIFIED WITH: Darrow Bussing @ [id number] 02/17/16 by [facility]    Culture ESCHERICHIA COLI (A)  Final   Report Status 02/20/2016 FINAL  Final   Organism ID, Bacteria ESCHERICHIA COLI  Final      Susceptibility   Escherichia coli - MIC*    AMPICILLIN >=32 RESISTANT Resistant     CEFAZOLIN >=64 RESISTANT Resistant     CEFEPIME <=1 SENSITIVE Sensitive     CEFTAZIDIME  <=1 SENSITIVE Sensitive     CEFTRIAXONE <=1 SENSITIVE Sensitive     CIPROFLOXACIN <=0.25 SENSITIVE Sensitive     GENTAMICIN <=1 SENSITIVE Sensitive     IMIPENEM <=0.25 SENSITIVE Sensitive     TRIMETH/SULFA <=20 SENSITIVE Sensitive     AMPICILLIN/SULBACTAM >=32 RESISTANT Resistant     PIP/TAZO >=128 RESISTANT Resistant     Extended ESBL NEGATIVE Sensitive     * ESCHERICHIA COLI  Blood Culture ID Panel (Reflexed)     Status: Abnormal   Collection Time: 02/16/16  8:59 PM  Result Value Ref Range Status   Enterococcus species NOT DETECTED NOT DETECTED Final   Listeria monocytogenes NOT DETECTED NOT DETECTED Final   Staphylococcus species NOT DETECTED NOT DETECTED Final   Staphylococcus aureus NOT DETECTED NOT DETECTED Final   Streptococcus species NOT DETECTED NOT DETECTED Final   Streptococcus agalactiae NOT DETECTED NOT DETECTED Final   Streptococcus pneumoniae NOT DETECTED NOT DETECTED Final   Streptococcus pyogenes NOT DETECTED NOT DETECTED Final   Acinetobacter baumannii NOT DETECTED NOT  DETECTED Final   Enterobacteriaceae species DETECTED (A) NOT DETECTED Final    Comment: Enterobacteriaceae represent a large family of gram-negative bacteria, not a single organism. CRITICAL RESULT CALLED TO, READ BACK BY AND VERIFIED WITH: Darrow Bussing @ 1255 02/17/16 by [place]    Enterobacter cloacae complex NOT DETECTED NOT DETECTED Final   Escherichia coli DETECTED (A) NOT DETECTED Final    Comment: CRITICAL RESULT CALLED TO, READ BACK BY AND VERIFIED WITH: Darrow Bussing @ 1255 02/17/16 by [place]    Klebsiella oxytoca NOT DETECTED NOT DETECTED Final   Klebsiella pneumoniae NOT DETECTED NOT DETECTED Final   Proteus species NOT DETECTED NOT DETECTED Final   Serratia marcescens NOT DETECTED NOT DETECTED Final   Carbapenem resistance NOT DETECTED NOT DETECTED Final   Haemophilus influenzae NOT DETECTED NOT DETECTED Final   Neisseria meningitidis NOT DETECTED NOT DETECTED Final   Pseudomonas aeruginosa  NOT DETECTED NOT DETECTED Final   Candida albicans NOT DETECTED NOT DETECTED Final   Candida glabrata NOT DETECTED NOT DETECTED Final   Candida krusei NOT DETECTED NOT DETECTED Final   Candida parapsilosis NOT DETECTED NOT DETECTED Final   Candida tropicalis NOT DETECTED NOT DETECTED Final  Culture, blood (Routine x 2)     Status: None (Preliminary result)   Collection Time: 02/16/16 11:04 PM  Result Value Ref Range Status   Specimen Description BLOOD LEFT ANTECUBITAL  Final   Special Requests BOTTLES DRAWN AEROBIC AND ANAEROBIC 5ccaero,5ccana  Final   Culture NO GROWTH 4 DAYS  Final   Report Status PENDING  Incomplete  Urine culture     Status: Abnormal   Collection Time: 02/16/16 11:04 PM  Result Value Ref Range Status   Specimen Description URINE, RANDOM  Final   Special Requests NONE  Final   Culture (A)  Final    50,000 COLONIES/mL DIPHTHEROIDS(CORYNEBACTERIUM SPECIES) Standardized susceptibility testing for this organism is not available. Performed at [facility], [place] [address], [place],  [address]    Report Status 02/18/2016 FINAL  Final    RADIOLOGY:  No results found.   Management plans discussed with the patient, family and they are in agreement.  CODE STATUS: Full code  TOTAL TIME TAKING CARE OF THIS PATIENT: 35 minutes.    Max Sane M.D on 02/20/2016 at 4:39 PM  Between 7am to 6pm - Pager - [phone]  After 6pm go to [email or web address] - [employer] brewer [facility]  Office  [phone]  CC: Primary care physician; FITZGERALD, DAVID Mamie Nick, MD   Note: This dictation was prepared with Dragon dictation along with smaller phrase technology. Any transcriptional errors that result from this process are unintentional.

## 2016-02-20 NOTE — Telephone Encounter (Signed)
His E coli was resistant to the augmentin he was dced on.  I  Spoke to patient. I have ordered bactrim ds for 10 days- sent to [address]

## 2016-02-21 LAB — CULTURE, BLOOD (ROUTINE X 2): CULTURE: NO GROWTH

## 2016-02-21 LAB — HEPATITIS C VRS RNA DETECT BY PCR-QUAL: HEPATITIS C VRS RNA BY PCR-QUAL: NEGATIVE

## 2016-02-26 DIAGNOSIS — R1084 Generalized abdominal pain: Secondary | ICD-10-CM | POA: Diagnosis not present

## 2016-02-26 DIAGNOSIS — E039 Hypothyroidism, unspecified: Secondary | ICD-10-CM | POA: Diagnosis not present

## 2016-02-26 DIAGNOSIS — I10 Essential (primary) hypertension: Secondary | ICD-10-CM | POA: Diagnosis not present

## 2016-03-01 DIAGNOSIS — R7881 Bacteremia: Secondary | ICD-10-CM | POA: Insufficient documentation

## 2016-03-01 DIAGNOSIS — A4151 Sepsis due to Escherichia coli [E. coli]: Secondary | ICD-10-CM | POA: Diagnosis not present

## 2016-03-01 DIAGNOSIS — R7989 Other specified abnormal findings of blood chemistry: Secondary | ICD-10-CM | POA: Diagnosis not present

## 2016-03-01 DIAGNOSIS — R740 Nonspecific elevation of levels of transaminase and lactic acid dehydrogenase [LDH]: Secondary | ICD-10-CM | POA: Diagnosis not present

## 2016-03-03 ENCOUNTER — Inpatient Hospital Stay
Admission: EM | Admit: 2016-03-03 | Discharge: 2016-03-05 | DRG: 948 | Disposition: A | Discharge status: Home / Self Care | Payer: Medicare Other | Attending: Specialist | Admitting: Specialist

## 2016-03-03 ENCOUNTER — Encounter: Payer: Self-pay | Admitting: Emergency Medicine

## 2016-03-03 DIAGNOSIS — E860 Dehydration: Secondary | ICD-10-CM | POA: Diagnosis present

## 2016-03-03 DIAGNOSIS — T370X5A Adverse effect of sulfonamides, initial encounter: Secondary | ICD-10-CM | POA: Diagnosis present

## 2016-03-03 DIAGNOSIS — R1010 Upper abdominal pain, unspecified: Secondary | ICD-10-CM

## 2016-03-03 DIAGNOSIS — R7989 Other specified abnormal findings of blood chemistry: Principal | ICD-10-CM | POA: Diagnosis present

## 2016-03-03 DIAGNOSIS — R17 Unspecified jaundice: Secondary | ICD-10-CM

## 2016-03-03 DIAGNOSIS — E871 Hypo-osmolality and hyponatremia: Secondary | ICD-10-CM | POA: Diagnosis present

## 2016-03-03 DIAGNOSIS — R112 Nausea with vomiting, unspecified: Secondary | ICD-10-CM | POA: Diagnosis present

## 2016-03-03 DIAGNOSIS — A419 Sepsis, unspecified organism: Secondary | ICD-10-CM | POA: Diagnosis not present

## 2016-03-03 DIAGNOSIS — Z9049 Acquired absence of other specified parts of digestive tract: Secondary | ICD-10-CM | POA: Diagnosis not present

## 2016-03-03 DIAGNOSIS — R740 Nonspecific elevation of levels of transaminase and lactic acid dehydrogenase [LDH]: Secondary | ICD-10-CM | POA: Diagnosis not present

## 2016-03-03 DIAGNOSIS — N281 Cyst of kidney, acquired: Secondary | ICD-10-CM | POA: Diagnosis not present

## 2016-03-03 DIAGNOSIS — E039 Hypothyroidism, unspecified: Secondary | ICD-10-CM | POA: Diagnosis present

## 2016-03-03 DIAGNOSIS — I10 Essential (primary) hypertension: Secondary | ICD-10-CM | POA: Diagnosis present

## 2016-03-03 DIAGNOSIS — R945 Abnormal results of liver function studies: Secondary | ICD-10-CM

## 2016-03-03 DIAGNOSIS — Z87891 Personal history of nicotine dependence: Secondary | ICD-10-CM | POA: Diagnosis not present

## 2016-03-03 DIAGNOSIS — R7401 Elevation of levels of liver transaminase levels: Secondary | ICD-10-CM

## 2016-03-03 HISTORY — DX: Dizziness and giddiness: R42

## 2016-03-03 HISTORY — DX: Hypothyroidism, unspecified: E03.9

## 2016-03-03 LAB — CBC WITH DIFFERENTIAL/PLATELET
Basophils Absolute: 0 10*3/uL (ref 0–0.1)
Basophils Relative: 0 %
EOS ABS: 0 10*3/uL (ref 0–0.7)
Eosinophils Relative: 0 %
HCT: 42.4 % (ref 40.0–52.0)
HEMOGLOBIN: 14.5 g/dL (ref 13.0–18.0)
LYMPHS ABS: 0.2 10*3/uL — AB (ref 1.0–3.6)
LYMPHS PCT: 2 %
MCH: 29.3 pg (ref 26.0–34.0)
MCHC: 34.3 g/dL (ref 32.0–36.0)
MCV: 85.5 fL (ref 80.0–100.0)
Monocytes Absolute: 0.6 10*3/uL (ref 0.2–1.0)
Monocytes Relative: 6 %
NEUTROS PCT: 92 %
Neutro Abs: 10.3 10*3/uL — ABNORMAL HIGH (ref 1.4–6.5)
Platelets: 449 10*3/uL — ABNORMAL HIGH (ref 150–440)
RBC: 4.95 MIL/uL (ref 4.40–5.90)
RDW: 14.8 % — ABNORMAL HIGH (ref 11.5–14.5)
WBC: 11.1 10*3/uL — AB (ref 3.8–10.6)

## 2016-03-03 LAB — PROTIME-INR
INR: 1.14
PROTHROMBIN TIME: 14.7 s (ref 11.4–15.2)

## 2016-03-03 LAB — URINALYSIS, COMPLETE (UACMP) WITH MICROSCOPIC
Bacteria, UA: NONE SEEN
Glucose, UA: 50 mg/dL — AB
Hgb urine dipstick: NEGATIVE
KETONES UR: NEGATIVE mg/dL
Leukocytes, UA: NEGATIVE
Nitrite: NEGATIVE
PH: 7 (ref 5.0–8.0)
PROTEIN: NEGATIVE mg/dL
Specific Gravity, Urine: 1.018 (ref 1.005–1.030)
Squamous Epithelial / LPF: NONE SEEN

## 2016-03-03 LAB — COMPREHENSIVE METABOLIC PANEL
ALK PHOS: 409 U/L — AB (ref 38–126)
ALT: 920 U/L — AB (ref 17–63)
AST: 802 U/L — AB (ref 15–41)
Albumin: 4.4 g/dL (ref 3.5–5.0)
Anion gap: 7 (ref 5–15)
BUN: 14 mg/dL (ref 6–20)
CALCIUM: 9.5 mg/dL (ref 8.9–10.3)
CO2: 26 mmol/L (ref 22–32)
CREATININE: 1.16 mg/dL (ref 0.61–1.24)
Chloride: 99 mmol/L — ABNORMAL LOW (ref 101–111)
GFR calc non Af Amer: >60 mL/min (ref >60)
Glucose, Bld: 218 mg/dL — ABNORMAL HIGH (ref 65–99)
Potassium: 4.3 mmol/L (ref 3.5–5.1)
SODIUM: 132 mmol/L — AB (ref 135–145)
Total Bilirubin: 3.1 mg/dL — ABNORMAL HIGH (ref 0.3–1.2)
Total Protein: 9 g/dL — ABNORMAL HIGH (ref 6.5–8.1)

## 2016-03-03 LAB — FERRITIN: Ferritin: 3228 ng/mL — ABNORMAL HIGH (ref 24–336)

## 2016-03-03 LAB — LIPASE, BLOOD: Lipase: 48 U/L (ref 11–51)

## 2016-03-03 LAB — TROPONIN I

## 2016-03-03 LAB — APTT: aPTT: 33 seconds (ref 24–36)

## 2016-03-03 MED ORDER — ONDANSETRON HCL 4 MG PO TABS: 4.0000 mg | ORAL_TABLET | Freq: Four times a day (QID) | ORAL | Status: DC | PRN

## 2016-03-03 MED ORDER — ONDANSETRON HCL 4 MG/2ML IJ SOLN: 4.0000 mg | Freq: Four times a day (QID) | INTRAMUSCULAR | Status: DC | PRN

## 2016-03-03 MED ORDER — OXYCODONE HCL 5 MG PO TABS: 5.0000 mg | ORAL_TABLET | ORAL | Status: DC | PRN

## 2016-03-03 MED ORDER — DEXTROSE 5 % IV SOLN
2.0000 g | Freq: Every day | INTRAVENOUS | Status: DC
  Administered 2016-03-03 – 2016-03-04 (×2): 2 g via INTRAVENOUS
  Filled 2016-03-03 (×3): qty 2

## 2016-03-03 MED ORDER — MORPHINE SULFATE (PF) 2 MG/ML IV SOLN: 2.0000 mg | INTRAVENOUS | Status: DC | PRN

## 2016-03-03 MED ORDER — BISOPROLOL FUMARATE 10 MG PO TABS
10.0000 mg | ORAL_TABLET | Freq: Every day | ORAL | Status: DC
  Filled 2016-03-03: qty 1

## 2016-03-03 MED ORDER — IBUPROFEN 400 MG PO TABS
400.0000 mg | ORAL_TABLET | Freq: Four times a day (QID) | ORAL | Status: DC | PRN
  Administered 2016-03-03: 400 mg via ORAL
  Filled 2016-03-03: qty 1

## 2016-03-03 MED ORDER — SODIUM CHLORIDE 0.9 % IV SOLN
INTRAVENOUS | Status: AC
  Administered 2016-03-03: 14:00:00 via INTRAVENOUS
  Administered 2016-03-04: 75 mL/h via INTRAVENOUS

## 2016-03-03 MED ORDER — BISOPROLOL FUMARATE 5 MG PO TABS
10.0000 mg | ORAL_TABLET | Freq: Every day | ORAL | Status: DC
  Administered 2016-03-04 – 2016-03-05 (×2): 10 mg via ORAL
  Filled 2016-03-03 (×4): qty 2

## 2016-03-03 MED ORDER — ENOXAPARIN SODIUM 40 MG/0.4ML ~~LOC~~ SOLN
40.0000 mg | SUBCUTANEOUS | Status: DC
  Administered 2016-03-03 – 2016-03-04 (×2): 40 mg via SUBCUTANEOUS
  Filled 2016-03-03 (×2): qty 0.4

## 2016-03-03 MED ORDER — PANTOPRAZOLE SODIUM 40 MG IV SOLR
40.0000 mg | Freq: Two times a day (BID) | INTRAVENOUS | Status: DC
  Administered 2016-03-03 – 2016-03-05 (×5): 40 mg via INTRAVENOUS
  Filled 2016-03-03 (×5): qty 40

## 2016-03-03 MED ORDER — LEVOTHYROXINE SODIUM 50 MCG PO TABS
50.0000 ug | ORAL_TABLET | Freq: Every day | ORAL | Status: DC
  Administered 2016-03-04 – 2016-03-05 (×2): 50 ug via ORAL
  Filled 2016-03-03 (×2): qty 1

## 2016-03-03 NOTE — ED Triage Notes (Signed)
Pt reports mid to upper abd pain that started yesterday that started after he ate went away then return again after eating. Vomited once this morning. Denies diarrhea . Reports low grade fever.

## 2016-03-03 NOTE — ED Notes (Signed)
Pt states he is unable to urinate at this time. Aware of need for urine sample. Given urinal at bedside.

## 2016-03-03 NOTE — H&P (Signed)
[place] [facility] [place] NAME: Jeremiah Price    MR#:  [id number]  DATE OF BIRTH:  07/13/1945  DATE OF ADMISSION:  03/03/2016  PRIMARY CARE PHYSICIAN: Placido Sou, MD   REQUESTING/REFERRING PHYSICIAN: Dr. Larae Grooms  CHIEF COMPLAINT:   Chief Complaint  Patient presents with  . Abdominal Pain    HISTORY OF PRESENT ILLNESS:  Jeremiah Price  is a 71 y.o. male with a known history of Hypertension, hypothyroidism, recent admission 2 weeks ago for Escherichia coli bacteremia and elevated LFTs who was discharged home on Bactrim presents again with 2 day history of nausea, vomiting and epigastric and mid abdominal pain. Patient was here with similar symptoms 2 weeks ago and had significantly elevated LFTs at the time. Hepatitis panel was negative, he was seen by GI. Blood cultures were growing Escherichia coli. Patient was on meropenem IV and was also seen by infectious diseases at the time. With improvement in his infection and sepsis, his LFTs were improving. He was discharged on Augmentin, antibiotics changed over to Bactrim after discharge due to resistance to Augmentin. Patient states he filled his Bactrim and has finished the course. He was doing well up until yesterday when he noticed that his urine is turning darker again. Yesterday afternoon after lunch she had significant abdominal pain associated with nausea. It resolved within a few minutes. The pain started again right after supper, however not resolving. Mostly in the epigastric and mid abdominal regions and associated with significant nausea. He started throwing up this morning. Denies any diarrhea or bloody stools. No recent travel. He complains of chills again at home with low-grade fevers. He has an elevated white count now insignificantly elevated LFTs again.  PAST MEDICAL HISTORY:   Past Medical History:  Diagnosis Date  . Hypertension   . Hypothyroidism   . Vertigo     PAST  SURGICAL HISTORY:   Past Surgical History:  Procedure Laterality Date  . CHOLECYSTECTOMY      SOCIAL HISTORY:   Social History  Substance Use Topics  . Smoking status: Former Research scientist (life sciences)  . Smokeless tobacco: Never Used     Comment: quit >30 years ago  . Alcohol use No    FAMILY HISTORY:   Family History  Problem Relation Age of Onset  . CAD Neg Hx     DRUG ALLERGIES:  No Known Allergies  REVIEW OF SYSTEMS:   Review of Systems  Constitutional: Positive for chills, fever and malaise/fatigue. Negative for weight loss.  HENT: Negative for ear discharge, ear pain, hearing loss and nosebleeds.   Eyes: Negative for blurred vision, double vision and photophobia.  Respiratory: Negative for cough, hemoptysis, shortness of breath and wheezing.   Cardiovascular: Negative for chest pain, palpitations, orthopnea and leg swelling.  Gastrointestinal: Positive for abdominal pain, nausea and vomiting. Negative for constipation, diarrhea, heartburn and melena.  Genitourinary: Negative for dysuria and urgency.       Dark urine  Musculoskeletal: Negative for back pain, myalgias and neck pain.  Skin: Negative for rash.  Neurological: Negative for dizziness, sensory change, speech change, focal weakness and headaches.  Endo/Heme/Allergies: Does not bruise/bleed easily.  Psychiatric/Behavioral: Negative for depression.    MEDICATIONS AT HOME:   Prior to Admission medications   Medication Sig Start Date End Date Taking? Authorizing Provider  acetaminophen (TYLENOL) 500 MG tablet Take 500 mg by mouth every 6 (six) hours as needed.   Yes Historical Provider, MD  bismuth subsalicylate (PEPTO BISMOL)  262 MG/15ML suspension Take 30 mLs by mouth every 6 (six) hours as needed.   Yes Historical Provider, MD  bisoprolol-hydrochlorothiazide (ZIAC) 5-6.25 MG tablet Take 1 tablet by mouth daily.   Yes Historical Provider, MD  famotidine (PEPCID AC) 10 MG chewable tablet Chew 10 mg by mouth 2 (two) times  daily.   Yes Historical Provider, MD  levothyroxine (SYNTHROID, LEVOTHROID) 50 MCG tablet Take by mouth.   Yes Historical Provider, MD      VITAL SIGNS:  Blood pressure (!) 160/91, pulse 100, temperature 99.1 F (37.3 C), temperature source Oral, resp. rate (!) 28, height 6' 3"  (1.905 m), weight 98.9 kg (218 lb), SpO2 94 %.  PHYSICAL EXAMINATION:   Physical Exam  GENERAL:  71 y.o.-year-old patient lying in the bed with no acute distress.  EYES: Pupils equal, round, reactive to light and accommodation. No scleral icterus. mildly erythematous conjunctiva bilaterally,Extraocular muscles intact.  HEENT: Head atraumatic, normocephalic. Oropharynx and nasopharynx clear.  NECK:  Supple, no jugular venous distention. No thyroid enlargement, no tenderness.  LUNGS: Normal breath sounds bilaterally, no wheezing, rales,rhonchi or crepitation. No use of accessory muscles of respiration.  CARDIOVASCULAR: S1, S2 normal. [name], rubs, or gallops.  ABDOMEN: Soft, nontender during my exam, nondistended. Hypoactive Bowel sounds present. No organomegaly or mass.  EXTREMITIES: No pedal edema, cyanosis, or clubbing.  NEUROLOGIC: Cranial nerves II through XII are intact. Muscle strength 5/5 in all extremities. Sensation intact. Gait not checked.  PSYCHIATRIC: The patient is alert and oriented x 3.  SKIN: No obvious rash, lesion, or ulcer.   LABORATORY PANEL:   CBC  Recent Labs Lab 03/03/16 0934  WBC 11.1*  HGB 14.5  HCT 42.4  PLT 449*   ------------------------------------------------------------------------------------------------------------------  Chemistries   Recent Labs Lab 03/03/16 0934  NA 132*  K 4.3  CL 99*  CO2 26  GLUCOSE 218*  BUN 14  CREATININE 1.16  CALCIUM 9.5  AST 802*  ALT 920*  ALKPHOS 409*  BILITOT 3.1*   ------------------------------------------------------------------------------------------------------------------  Cardiac Enzymes  Recent Labs Lab  03/03/16 0934  TROPONINI <0.03   ------------------------------------------------------------------------------------------------------------------  RADIOLOGY:  No results found.  EKG:   Orders placed or performed during the hospital encounter of 03/03/16  . EKG 12-Lead  . EKG 12-Lead  . ED EKG  . ED EKG    IMPRESSION AND PLAN:   Jeremiah Price  is a 71 y.o. male with a known history of Hypertension, hypothyroidism, recent admission 2 weeks ago for Escherichia coli bacteremia and elevated LFTs who was discharged home on Bactrim presents again with 2 day history of nausea, vomiting and epigastric and mid abdominal pain.  #1 Sepsis-recent E.coli bacteremia 2 weeks ago- finished bactrim course. But chills, fevers, tachycardia and leukocytosis again - repeat cultures, started on rocephin based on recent sensitivities - ID consulted. - UA pending  #2 Elevated LFTs- similar presentation 2 weeks ago, but had E.coli bacteremia and LFTs were improving at the time of discharge - denies alcohol use, acute hepatitis panel negative recently. [place] and CT abdomen last admission with slightly increased liver echogenicity but no other abnormalities, he is s/p cholecystectomy - mostly post prandial pain - MRCP ordered as increased bili and alk phos this time - GI consult, liquid diet for now, NPO after midnight - Protonix IV - ANA, and autoimmune liver ab ordered  #3 Hyponatremia- dehydration, hold HCTZ and start IV fluids  #4 HTN- due to hyponatremia, hold HCTZ, continue bisoprolol  #5 Hypothyroidism- on synthroid  #6  DVT Prophylaxis- on lovenox - please hold for any procedures   All the records are reviewed and case discussed with ED provider. Management plans discussed with the patient, family and they are in agreement.  CODE STATUS: Full code  TOTAL TIME TAKING CARE OF THIS PATIENT: 50 minutes.    Gladstone Lighter M.D on 03/03/2016 at 11:32 AM  Between 7am to 6pm - Pager -  [phone]  After 6pm go to [email or web address] - password [email or web address] [facility]  Office  [phone]  CC: Primary care physician; Placido Sou, MD

## 2016-03-03 NOTE — ED Provider Notes (Signed)
[facility] Emergency Department Provider Note  ____________________________________________   First MD Initiated Contact with Patient 03/03/16 [phone]     (approximate)  I have reviewed the triage vital signs and the nursing notes.   HISTORY  Chief Complaint Abdominal Pain   HPI Jeremiah Price is a 71 y.o. male with a recent hospital admission for elevated liver labs as well as Escherichia coli bacteremia likely from a GI source was presented to the emergency department today with upper abdominal pain. He says that yesterday he ate a large lunch and had abdominal pain afterwards which resolved after rest and not eating for several hours. He says that he then is small piece of fish and to "care 08 balls" last night and then had pain throughout the night. He says the pain was sharp and cramping and radiated from his pubic area all the way up to his upper abdomen. He denies any chest pain. Also one episode of vomiting this morning which was nonbloody. Also says that he moved his bowels about 4 AM and did not have any diarrhea or blood in his stool. He is denying any pain at this time. Reported in triage that he a low-grade fever at home but denies fever to me. Said that he had finished his course of Augmentin at home.   Past Medical History:  Diagnosis Date  . Hypertension   . Thyroid disease   . Vertigo     Patient Active Problem List   Diagnosis Date Noted  . Abdominal pain 02/17/2016  . Transaminitis 02/17/2016  . Hypothyroidism 02/17/2016  . HTN (hypertension) 02/17/2016  . Sepsis ([place]) 02/17/2016    Past Surgical History:  Procedure Laterality Date  . CHOLECYSTECTOMY      Prior to Admission medications   Medication Sig Start Date End Date Taking? Authorizing Provider  amoxicillin-clavulanate (AUGMENTIN) 875-125 MG tablet Take 1 tablet by mouth 2 (two) times daily. 02/19/16   Max Sane, MD  bisoprolol-hydrochlorothiazide (ZIAC) 5-6.25 MG tablet  Take 1 tablet by mouth daily.    Historical Provider, MD  levothyroxine (SYNTHROID, LEVOTHROID) 50 MCG tablet Take by mouth.    Historical Provider, MD    Allergies Patient has no known allergies.  Family History  Problem Relation Age of Onset  . CAD Neg Hx     Social History Social History  Substance Use Topics  . Smoking status: Former Research scientist (life sciences)  . Smokeless tobacco: Never Used  . Alcohol use No    Review of Systems Constitutional: No fever/chills Eyes: No visual changes. ENT: No sore throat. Cardiovascular: Denies chest pain. Respiratory: Denies shortness of breath. Gastrointestinal:   No diarrhea.  No constipation. Genitourinary: Negative for dysuria. Musculoskeletal: Negative for back pain. Skin: Negative for rash. Neurological: Negative for headaches, focal weakness or numbness.  10-point ROS otherwise negative.  ____________________________________________   PHYSICAL EXAM:  VITAL SIGNS: ED Triage Vitals  Enc Vitals Group     BP 03/03/16 0843 (!) 148/83     Pulse Rate 03/03/16 0843 99     Resp 03/03/16 0843 18     Temp 03/03/16 0843 99.1 F (37.3 C)     Temp Source 03/03/16 0843 Oral     SpO2 03/03/16 0843 97 %     Weight 03/03/16 0844 218 lb (98.9 kg)     Height 03/03/16 0844 6\' 3"  (1.905 m)     Head Circumference --      Peak Flow --      Pain Score 03/03/16  0900 9     Pain Loc --      Pain Edu? --      Excl. in [place]? --     Constitutional: Alert and oriented. Well appearing and in no acute distress. Eyes: Conjunctivae are normal. PERRL. [name]. Head: Atraumatic. Nose: No congestion/rhinnorhea. Mouth/Throat: Mucous membranes are moist.   Neck: No stridor.   Cardiovascular: Normal rate, regular rhythm. Grossly normal heart sounds.   Respiratory: Normal respiratory effort.  No retractions. Lungs CTAB. Gastrointestinal: Soft and nontender. No distention.  Musculoskeletal: No lower extremity tenderness nor edema.  No joint effusions. Neurologic:   Normal speech and language. No gross focal neurologic deficits are appreciated. No gait instability. Skin:  Skin is warm, dry and intact. No rash noted. Psychiatric: Mood and affect are normal. Speech and behavior are normal.  ____________________________________________   LABS (all labs ordered are listed, but only abnormal results are displayed)  Labs Reviewed  CBC WITH DIFFERENTIAL/PLATELET - Abnormal; Notable for the following:       Result Value   WBC 11.1 (*)    RDW 14.8 (*)    Platelets 449 (*)    Neutro Abs 10.3 (*)    Lymphs Abs 0.2 (*)    All other components within normal limits  COMPREHENSIVE METABOLIC PANEL  LIPASE, BLOOD  TROPONIN I  URINALYSIS, COMPLETE (UACMP) WITH MICROSCOPIC   ____________________________________________  EKG  ED ECG REPORT I, Doran Stabler, the attending physician, personally viewed and interpreted this ECG.   Date: 03/03/2016  EKG Time: 0845  Rate: 96  Rhythm: normal sinus rhythm  Axis: normal  Intervals:none  ST&T Change: No ST segment elevation or depression. No abnormal T-wave inversion.  ____________________________________________  RADIOLOGY   ____________________________________________   PROCEDURES  Procedure(s) performed:   Procedures  Critical Care performed:   ____________________________________________   INITIAL IMPRESSION / ASSESSMENT AND PLAN / ED COURSE  Pertinent labs & imaging results that were available during my care of the patient were reviewed by me and considered in my medical decision making (see chart for details).  ----------------------------------------- 11:14 AM on 03/03/2016 -----------------------------------------  Patient's labs appear elevated again today including his transaminases and bilirubin. I discussed the case with both infectious disease and gastroenterology. Infectious disease, Dr. Linus Salmons, suspect GI source. I then discussed with Dr. Marius Ditch, of gastroenterology  recommends admission to the hospital as well as checking the patient's coagulation studies. She does not recommend any antibiotics at this time. I discussed this plan with the patient and he is understandable and to comply. Signed out to Dr. Tressia Miners.     ____________________________________________   FINAL CLINICAL IMPRESSION(S) / ED DIAGNOSES  Dental pain. Vomiting. Elevated transaminases as well as bilirubin.    NEW MEDICATIONS STARTED DURING THIS VISIT:  New Prescriptions   No medications on file     Note:  This document was prepared using Dragon voice recognition software and may include unintentional dictation errors.    Orbie Pyo, MD 03/03/16 [phone]

## 2016-03-03 NOTE — Progress Notes (Signed)
Pharmacy Antibiotic Note  Jeremiah Price is a 71 y.o. male admitted on 03/03/2016 with intra-abdominal infection.  Pharmacy has been consulted for ceftriaxone dosing.  Plan: Ceftriaxone 2 g IV daily given recent history of E.coli bacteremia per H&P.  Height: 6\' 3"  (190.5 cm) Weight: 218 lb (98.9 kg) IBW/kg (Calculated) : 84.5  Temp (24hrs), Avg:99.1 F (37.3 C), Min:99.1 F (37.3 C), Max:99.1 F (37.3 C)   Recent Labs Lab 03/03/16 0934  WBC 11.1*  CREATININE 1.16    Estimated Creatinine Clearance: 72.8 mL/min (by C-G formula based on SCr of 1.16 mg/dL).    No Known Allergies  Antimicrobials this admission: ceftriaxone 2/25 >>   Dose adjustments this admission:  Microbiology results: 2/25 BCx: Sent  Thank you for allowing pharmacy to be a part of this patient's care.  Lenis Noon, PharmD Clinical Pharmacist 03/03/2016 11:46 AM

## 2016-03-03 NOTE — ED Notes (Signed)
See triage note. Pt c/o mid-abd pain starting last night. Similar to previous episode when seen in this ED a few weeks ago. States he took pepto-bismol and pepcid AC this morning PTA and that pain has decreased from a 9 to a 5 while in ED.

## 2016-03-04 ENCOUNTER — Inpatient Hospital Stay: Payer: Medicare Other

## 2016-03-04 DIAGNOSIS — R7989 Other specified abnormal findings of blood chemistry: Principal | ICD-10-CM

## 2016-03-04 LAB — COMPREHENSIVE METABOLIC PANEL
ALK PHOS: 297 U/L — AB (ref 38–126)
ALT: 501 U/L — AB (ref 17–63)
AST: 280 U/L — AB (ref 15–41)
Albumin: 3.3 g/dL — ABNORMAL LOW (ref 3.5–5.0)
Anion gap: 6 (ref 5–15)
BILIRUBIN TOTAL: 3.5 mg/dL — AB (ref 0.3–1.2)
BUN: 11 mg/dL (ref 6–20)
CALCIUM: 8.8 mg/dL — AB (ref 8.9–10.3)
CO2: 25 mmol/L (ref 22–32)
CREATININE: 0.9 mg/dL (ref 0.61–1.24)
Chloride: 105 mmol/L (ref 101–111)
GFR calc Af Amer: >60 mL/min (ref >60)
GLUCOSE: 96 mg/dL (ref 65–99)
Potassium: 3.8 mmol/L (ref 3.5–5.1)
Sodium: 136 mmol/L (ref 135–145)
TOTAL PROTEIN: 7.1 g/dL (ref 6.5–8.1)

## 2016-03-04 LAB — IRON AND TIBC
Iron: 50 ug/dL (ref 45–182)
Saturation Ratios: 14 % — ABNORMAL LOW (ref 17.9–39.5)
TIBC: 349 ug/dL (ref 250–450)
UIBC: 299 ug/dL

## 2016-03-04 LAB — CBC
HCT: 36.7 % — ABNORMAL LOW (ref 40.0–52.0)
Hemoglobin: 12.8 g/dL — ABNORMAL LOW (ref 13.0–18.0)
MCH: 29.7 pg (ref 26.0–34.0)
MCHC: 35 g/dL (ref 32.0–36.0)
MCV: 84.9 fL (ref 80.0–100.0)
PLATELETS: 361 10*3/uL (ref 150–440)
RBC: 4.33 MIL/uL — ABNORMAL LOW (ref 4.40–5.90)
RDW: 14.7 % — AB (ref 11.5–14.5)
WBC: 4.3 10*3/uL (ref 3.8–10.6)

## 2016-03-04 LAB — TSH: TSH: 0.797 u[IU]/mL (ref 0.350–4.500)

## 2016-03-04 LAB — CERULOPLASMIN: Ceruloplasmin: 28.4 mg/dL (ref 16.0–31.0)

## 2016-03-04 MED ORDER — ENSURE ENLIVE PO LIQD
237.0000 mL | Freq: Two times a day (BID) | ORAL | Status: DC
  Administered 2016-03-05: 237 mL via ORAL

## 2016-03-04 MED ORDER — GADOBENATE DIMEGLUMINE 529 MG/ML IV SOLN
20.0000 mL | Freq: Once | INTRAVENOUS | Status: AC | PRN
  Administered 2016-03-04: 20 mL via INTRAVENOUS

## 2016-03-04 NOTE — Progress Notes (Signed)
Initial Nutrition Assessment  DOCUMENTATION CODES:   Not applicable  INTERVENTION:  1. Ensure Enlive po BID, each supplement provides 350 kcal and 20 grams of protein  NUTRITION DIAGNOSIS:   Inadequate oral intake related to nausea, vomiting, poor appetite as evidenced by per patient/family report.  GOAL:   Patient will meet greater than or equal to 90% of their needs  MONITOR:   PO intake, I & O's, Labs, Weight trends, Supplement acceptance  REASON FOR ASSESSMENT:   Malnutrition Screening Tool    ASSESSMENT:   Jeremiah Price  is a 71 y.o. male with a known history of Hypertension, hypothyroidism, recent admission 2 weeks ago for Escherichia coli bacteremia and elevated LFTs who was discharged home on Bactrim presents again with 2 day history of nausea, vomiting and epigastric and mid abdominal pain.  Spoke with Jeremiah Price, Jeremiah Price. Reports poor appetite x2 days related to nausea/vomiting/abd pain. Resolving, patient states he had no pain this morning. Hungry. Patient was eating normally following admission for E.Coli 2 weeks ago. Between then and now - reports weight is down 12#/5.3% severe wt loss over 3 weeks. Diet advanced now. Was NPO this morning had not had any food yet. Nutrition-Focused physical exam completed. Findings are no fat depletion, no muscle depletion, and no edema.  Labs and medications reviewed: Elevated LFTs. TBili 3.5     Diet Order:  Diet Heart Room service appropriate? Yes; Fluid consistency: Thin  Skin:  Reviewed, no issues  Last BM:  02/15/2016  Height:   Ht Readings from Last 1 Encounters:  03/03/16 6\' 3"  (1.905 m)    Weight:   Wt Readings from Last 1 Encounters:  03/03/16 213 lb 3.2 oz (96.7 kg)    Ideal Body Weight:  89.09 kg  BMI:  Body mass index is 26.65 kg/m.  Estimated Nutritional Needs:   Kcal:  2200-2600 calories  Protein:  96-116 gm  Fluid:  >/= 2.2L  EDUCATION NEEDS:   No education needs  identified at this time  Jeremiah Anis. Tannah Dreyfuss, MS, RD LDN Inpatient Clinical Dietitian Pager [phone]

## 2016-03-04 NOTE — Progress Notes (Signed)
[place] at [place] NAME: Jeremiah Price    MR#:  [id number]  DATE OF BIRTH:  03-Mar-1945  SUBJECTIVE:   Pt. Here due to abdominal pain and noted to have abnormal LFT's.  LFT's improved.  Pain has now resolved.  No N/V or any other symptoms this a.m. Just had MRCP done Which shows no evidence of choledocholithiasis.  REVIEW OF SYSTEMS:    Review of Systems  Constitutional: Negative for chills and fever.  HENT: Negative for congestion and tinnitus.   Eyes: Negative for blurred vision and double vision.  Respiratory: Negative for cough, shortness of breath and wheezing.   Cardiovascular: Negative for chest pain, orthopnea and PND.  Gastrointestinal: Negative for abdominal pain, diarrhea, nausea and vomiting.  Genitourinary: Negative for dysuria and hematuria.  Neurological: Negative for dizziness, sensory change and focal weakness.  All other systems reviewed and are negative.   Nutrition: NPO Tolerating Diet: No Tolerating PT: Ambulatory  DRUG ALLERGIES:  No Known Allergies  VITALS:  Blood pressure 126/84, pulse 76, temperature 98.3 F (36.8 C), temperature source Oral, resp. rate 18, height 6\' 3"  (1.905 m), weight 96.7 kg (213 lb 3.2 oz), SpO2 97 %.  PHYSICAL EXAMINATION:   Physical Exam  GENERAL:  71 y.o.-year-old patient lying in the bed in no acute distress.  EYES: Pupils equal, round, reactive to light and accommodation. No scleral icterus. Extraocular muscles intact.  HEENT: Head atraumatic, normocephalic. Oropharynx and nasopharynx clear.  NECK:  Supple, no jugular venous distention. No thyroid enlargement, no tenderness.  LUNGS: Normal breath sounds bilaterally, no wheezing, rales, rhonchi. No use of accessory muscles of respiration.  CARDIOVASCULAR: S1, S2 normal. [name], rubs, or gallops.  ABDOMEN: Soft, nontender, nondistended. Bowel sounds present. No organomegaly or mass.  EXTREMITIES: No cyanosis, clubbing or  edema b/l.    NEUROLOGIC: Cranial nerves II through XII are intact. No focal Motor or sensory deficits b/l.   PSYCHIATRIC: The patient is alert and oriented x 3.  SKIN: No obvious rash, lesion, or ulcer.    LABORATORY PANEL:   CBC  Recent Labs Lab 03/04/16 0440  WBC 4.3  HGB 12.8*  HCT 36.7*  PLT 361   ------------------------------------------------------------------------------------------------------------------  Chemistries   Recent Labs Lab 03/04/16 0440  NA 136  K 3.8  CL 105  CO2 25  GLUCOSE 96  BUN 11  CREATININE 0.90  CALCIUM 8.8*  AST 280*  ALT 501*  ALKPHOS 297*  BILITOT 3.5*   ------------------------------------------------------------------------------------------------------------------  Cardiac Enzymes  Recent Labs Lab 03/03/16 0934  TROPONINI <0.03   ------------------------------------------------------------------------------------------------------------------  RADIOLOGY:  Mr Abdomen Mrcp Wo Contrast  Result Date: 03/04/2016 CLINICAL DATA:  Epigastric pain. EXAM: MRI ABDOMEN WITHOUT CONTRAST  (INCLUDING MRCP) TECHNIQUE: Multiplanar multisequence MR imaging of the abdomen was performed. Heavily T2-weighted images of the biliary and pancreatic ducts were obtained, and three-dimensional MRCP images were rendered by post processing. COMPARISON:  CT scan 02/16/2016 FINDINGS: Lower chest:  Unremarkable. Hepatobiliary: No focal abnormality identified within the liver parenchyma. Liver gallbladder surgically absent. No intrahepatic Biliary duct dilatation. Mild distention of the proximal common duct up to 11 mm diameter, but mid and distal common duct and common bile duct are normal diameter. No intraluminal filling defect to suggest choledocholithiasis. No abrupt stricture. Pancreas: No focal mass lesion. No dilatation of the main duct. No intraparenchymal cyst. No peripancreatic edema. Spleen: No splenomegaly. No focal mass lesion. Adrenals/Urinary  Tract: No adrenal nodule or mass. Several tiny cortical cysts are  noted in the kidneys bilaterally. No hydronephrosis. No enhancing renal mass. Adrenal glands are normal. Stomach/Bowel: Stomach and duodenum unremarkable. No small bowel or colon dilatation in the visualized abdomen. Vascular/Lymphatic: Infrarenal abdominal aortic aneurysm not fully characterized on axial imaging today and better seen on the previous CT scan. There is no gastrohepatic or hepatoduodenal ligament lymphadenopathy. No intraperitoneal or retroperitoneal lymphadenopathy. Other: No intraperitoneal free fluid. Musculoskeletal: No abnormal marrow enhancement within the visualized bony anatomy. IMPRESSION: 1. Mild prominence of the extrahepatic common duct without intrahepatic or distal common bile duct dilatation. No choledocholithiasis. 2. Abdominal aortic aneurysm better seen on the recent CT scan. 3. Tiny bilateral renal cysts. Electronically Signed   By: Misty Stanley M.D.   On: 03/04/2016 13:06     ASSESSMENT AND PLAN:   Jeremiah Price  is a 71 y.o. male with a known history of Hypertension, hypothyroidism, recent admission 2 weeks ago for Escherichia coli bacteremia and elevated LFTs who was discharged home on Bactrim presents again with 2 day history of nausea, vomiting and epigastric and mid abdominal pain.  #1 Sepsis-recent E.coli bacteremia 2 weeks ago- finished bactrim course. But chills, fevers, tachycardia and leukocytosis again.  - cont. IV Rocephin for now. UA (-). Cultures so far are (-). Clinically afebrile, hemodynamically stable.  - await ID input but likely can come off abx   #2 Elevated LFTs- similar presentation 2 weeks ago, but had E.coli bacteremia and LFTs were improving at the time of discharge - denies alcohol use, acute hepatitis panel negative recently. [place] and CT abdomen last admission with slightly increased liver echogenicity but no other abnormalities, he is s/p cholecystectomy - mostly post  prandial pain - MRCP done showing no evidence of choledocholithiasis. Await further gastroenterology input.  #3 Hyponatremia- mild, resolved w/ IV fluids.  - cont. To hold HCTZ for now.   #4 HTN- cont. Bisoprolol. BP stable.   #5 Hypothyroidism- cont. synthroid   All the records are reviewed and case discussed with Care Management/Social Worker. Management plans discussed with the patient, family and they are in agreement.  CODE STATUS: Full Code  DVT Prophylaxis: Lovenox  TOTAL TIME TAKING CARE OF THIS PATIENT: 30 minutes.   POSSIBLE D/C IN 1-2 DAYS, DEPENDING ON CLINICAL CONDITION.   Henreitta Leber M.D on 03/04/2016 at 1:58 PM  Between 7am to 6pm - Pager - [phone]  After 6pm go to [email or web address] - Proofreader  [employer] [facility]  Office  [phone]  CC: Primary care physician; Placido Sou, MD

## 2016-03-04 NOTE — Progress Notes (Signed)
Pharmacy Antibiotic Note  Jeremiah Price is a 71 y.o. male admitted on 03/03/2016 with recent history of UTI.  Pharmacy has been consulted for ceftriaxone dosing.  Plan: Continue ceftriaxone 2g IV Q24hr for total of 3 days.   Height: 6\' 3"  (190.5 cm) Weight: 213 lb 3.2 oz (96.7 kg) IBW/kg (Calculated) : 84.5  Temp (24hrs), Avg:98.2 F (36.8 C), Min:97.7 F (36.5 C), Max:98.3 F (36.8 C)   Recent Labs Lab 03/03/16 0934 03/04/16 0440  WBC 11.1* 4.3  CREATININE 1.16 0.90    Estimated Creatinine Clearance: 93.9 mL/min (by C-G formula based on SCr of 0.9 mg/dL).    No Known Allergies  Antimicrobials this admission: ceftriaxone 2/25 >>   Dose adjustments this admission:  Microbiology results: 2/25 BCx: no growth < 24 hours.   Pharmacy will continue to monitor and adjust per consult.    Simpson,Michael L 03/04/2016 4:35 PM

## 2016-03-04 NOTE — Consult Note (Signed)
Jeremiah Bellows MD  [address] [place], [place] [address] Phone: [phone] Fax : [phone]  Consultation  Referring Provider:    Dr Tressia Miners Primary Care Physician:  Placido Sou, MD Primary Gastroenterologist:  None   Reason for Consultation:     Abnormal LFT  Date of Admission:  03/03/2016 Date of Consultation:  03/04/2016         HPI:   Jeremiah Price is a 71 y.o. male was admitted initially on 02/17/16 with abdominal pain, abnormal LFT's (predominently hepatocellular ), nausea and vomiting . He was found to have E coli bacteremia , was treated with Augmentin and plan was for OP GI follow up and colonoscopy due to possibility of colonic pathology that contributed to e coli translocation . RUQ USG and doppler USG were negative. Acute hepatitis panel, drug screen were also normal. Dr Ola Spurr called him on 02/20/16 and changed antibiotics from Augmentin to Bactrim as the ecoli was resistant to the Augmentin which he completed last Friday  . At the time of discharge his LFT;s were rapidly improving  He was readmitted yesterday 03/03/16 with upper abdominal pain after he ate on 03/02/16. He had some chills at home with low grade fever He also noticed that his urine was turning dark . He has had a cholecystectomy in the past .   Presently no abdominal pain, vomiting or nausea. MRCP performed today shows no biliary obstruction.   Past Medical History:  Diagnosis Date  . Hypertension   . Hypothyroidism   . Vertigo     Past Surgical History:  Procedure Laterality Date  . CHOLECYSTECTOMY      Prior to Admission medications   Medication Sig Start Date End Date Taking? Authorizing Provider  acetaminophen (TYLENOL) 500 MG tablet Take 500 mg by mouth every 6 (six) hours as needed.   Yes Historical Provider, MD  bismuth subsalicylate (PEPTO BISMOL) 262 MG/15ML suspension Take 30 mLs by mouth every 6 (six) hours as needed.   Yes Historical Provider, MD  bisoprolol-hydrochlorothiazide (ZIAC)  5-6.25 MG tablet Take 1 tablet by mouth daily.   Yes Historical Provider, MD  famotidine (PEPCID AC) 10 MG chewable tablet Chew 10 mg by mouth 2 (two) times daily.   Yes Historical Provider, MD  levothyroxine (SYNTHROID, LEVOTHROID) 50 MCG tablet Take by mouth.   Yes Historical Provider, MD    Family History  Problem Relation Age of Onset  . CAD Neg Hx      Social History  Substance Use Topics  . Smoking status: Former Research scientist (life sciences)  . Smokeless tobacco: Never Used     Comment: quit >30 years ago  . Alcohol use No    Allergies as of 03/03/2016  . (No Known Allergies)    Review of Systems:    All systems reviewed and negative except where noted in HPI.   Physical Exam:  Vital signs in last 24 hours: Temp:  [98.3 F (36.8 C)-100.2 F (37.9 C)] 98.3 F (36.8 C) (02/26 0809) Pulse Rate:  [71-115] 76 (02/26 0809) Resp:  [18-28] 18 (02/26 0342) BP: (114-166)/(62-97) 126/84 (02/26 0809) SpO2:  [94 %-100 %] 97 % (02/26 0809) Weight:  [213 lb 3.2 oz (96.7 kg)] 213 lb 3.2 oz (96.7 kg) (02/25 1311)   General:   Pleasant, cooperative in NAD Head:  Normocephalic and atraumatic. Eyes:   No icterus.   Conjunctiva pink. PERRLA. Ears:  Normal auditory acuity. Neck:  Supple; no masses or thyroidomegaly Lungs: Respirations even and unlabored. Lungs clear to auscultation bilaterally.  No wheezes, crackles, or rhonchi.  Heart:  Regular rate and rhythm;  Without murmur, clicks, rubs or gallops Abdomen:  Soft, nondistended, nontender. Normal bowel sounds. No appreciable masses or hepatomegaly.  No rebound or guarding.  Rectal:  Not performed. Msk:  Symmetrical without gross deformities.   Extremities:  Without edema, cyanosis or clubbing. Neurologic:  Alert and oriented x3;  grossly normal neurologically. Skin:  Intact without significant lesions or rashes. Cervical Nodes:  No significant cervical adenopathy. Psych:  Alert and cooperative. Normal affect.  LAB RESULTS:  Recent Labs   03/03/16 0934 03/04/16 0440  WBC 11.1* 4.3  HGB 14.5 12.8*  HCT 42.4 36.7*  PLT 449* 361   BMET  Recent Labs  03/03/16 0934 03/04/16 0440  NA 132* 136  K 4.3 3.8  CL 99* 105  CO2 26 25  GLUCOSE 218* 96  BUN 14 11  CREATININE 1.16 0.90  CALCIUM 9.5 8.8*   LFT  Recent Labs  03/04/16 0440  PROT 7.1  ALBUMIN 3.3*  AST 280*  ALT 501*  ALKPHOS 297*  BILITOT 3.5*   Hepatic Function Latest Ref Rng & Units 03/04/2016 03/03/2016 02/19/2016  Total Protein 6.5 - 8.1 g/dL 7.1 9.0(H) 6.5  Albumin 3.5 - 5.0 g/dL 3.3(L) 4.4 3.1(L)  AST 15 - 41 U/L 280(H) 802(H) 101(H)  ALT 17 - 63 U/L 501(H) 920(H) 253(H)  Alk Phosphatase 38 - 126 U/L 297(H) 409(H) 186(H)  Total Bilirubin 0.3 - 1.2 mg/dL 3.5(H) 3.1(H) 1.5(H)    PT/INR  Recent Labs  03/03/16 1141  LABPROT 14.7  INR 1.14    STUDIES: No results found.    Impression / Plan:   Jeremiah Price is a 71 y.o. y/o male with recent history of E coli bacteremia, treated with Augmentin, Bactrim. At the same time was diagnosed with markedly elevated transaminases which rapidly improved prior to his discharge  2 weeks back. Readmitted yesterday with abdominal pain, chills , elevated transaminases . No evidence of liver failure. Good synthetic function per labs. Hep C viral RNA,Hep B viral DNA, Hep A IGM (02/19/16)  Were negative.   Impression:  Differentials are Drug induced(bactrim) . It is again improving rapidly since admission vs  autoimmune hepatitis but usually do not resolve rapidly .   Plan  1. Full autoimmune work up  2. Daily LFT's and INR - if rises again will need liver biopsy in hospital. If improving then can be followed closely as an outpatient while we obtain serological studies( ANA, AMA,Ceruloplasmin, A1AT, LKM antibody ,celiac serology, Smooth muscle antibody, Ck, TIBC ) 3. ID is following with regards to his sepsis which he was recently treated for.  4. Ferritin is very elevated which can be seen in any  inflammatory response as an acute inflammatory marker. I will still order genetic testing for hemochromotosis , it takes a while to get the results back.  5. Will check viral serological studies.   Thank you for involving me in the care of this patient.      LOS: 1 day   Jeremiah Bellows, MD  03/04/2016, 9:05 AM

## 2016-03-04 NOTE — Progress Notes (Signed)
Pt has returned to room from MRCP. Wife at bedside asking to speak to doctor. This Probation officer explained that Dr. Verdell Carmine already has seen pt at 0930. Wife stated she has some questions for him. Will text page physician.

## 2016-03-04 NOTE — Progress Notes (Signed)
Shift assessment completed. Pt is awake, alert and oriented, denied pain, states he feels better and wants to eat. Sclera is slightly yellowed, pt stated he is itching. Lungs are clear bilat, HR is regular, abdomen is soft, bs heard. Pt is voiding in urinal, urine emptied, orange in appearance but clear. Ppp, piv #20 intact to l arm with site free of redness and swelling, ns infusing at 56mls/hr. scd's removed. Wife at bedside. Dr.Sainani in on rounds at 0930, discussed with pt that his itching si from high levels of bilirubin due to liver function, Dr. Verdell Carmine explained to pt reason to have MRCP done. Pt left the unit via his bed at approx 0950 for MRI.

## 2016-03-04 NOTE — Plan of Care (Signed)
Problem: Bowel/Gastric: Goal: Will not experience complications related to bowel motility Outcome: Progressing Pt is progressing toward goal of discharge. Pt has remained cooperative, has been free of falls/injury this shift.

## 2016-03-04 NOTE — Progress Notes (Signed)
Pt requesting to go home. This Probation officer spoke to Dr. Verdell Carmine who stated that pt needs labs rechecked in the morning per Gi, may d/c tomorrow possibly. Pt and wife were given this information.

## 2016-03-05 LAB — HEPATITIS B E ANTIGEN: Hep B E Ag: NEGATIVE

## 2016-03-05 LAB — COMPREHENSIVE METABOLIC PANEL
ALT: 381 U/L — AB (ref 17–63)
AST: 140 U/L — ABNORMAL HIGH (ref 15–41)
Albumin: 3.7 g/dL (ref 3.5–5.0)
Alkaline Phosphatase: 292 U/L — ABNORMAL HIGH (ref 38–126)
Anion gap: 6 (ref 5–15)
BUN: 12 mg/dL (ref 6–20)
CALCIUM: 9.2 mg/dL (ref 8.9–10.3)
CO2: 25 mmol/L (ref 22–32)
CREATININE: 1.03 mg/dL (ref 0.61–1.24)
Chloride: 103 mmol/L (ref 101–111)
Glucose, Bld: 142 mg/dL — ABNORMAL HIGH (ref 65–99)
Potassium: 3.7 mmol/L (ref 3.5–5.1)
Sodium: 134 mmol/L — ABNORMAL LOW (ref 135–145)
TOTAL PROTEIN: 7.9 g/dL (ref 6.5–8.1)
Total Bilirubin: 1.4 mg/dL — ABNORMAL HIGH (ref 0.3–1.2)

## 2016-03-05 LAB — ALPHA-1 ANTITRYPSIN PHENOTYPE: A-1 Antitrypsin, Ser: 209 mg/dL — ABNORMAL HIGH (ref 90–200)

## 2016-03-05 LAB — EBV AB TO VIRAL CAPSID AG PNL, IGG+IGM
EBV VCA IgG: 538 U/mL — ABNORMAL HIGH (ref 0.0–17.9)
EBV VCA IgM: <36 U/mL (ref 0.0–35.9)

## 2016-03-05 LAB — HEPATITIS B SURFACE ANTIGEN: HEP B S AG: NEGATIVE

## 2016-03-05 LAB — ANTI-MICROSOMAL ANTIBODY LIVER / KIDNEY: LKM1 Ab: 2.5 Units (ref 0.0–20.0)

## 2016-03-05 LAB — HEPATITIS B E ANTIBODY: HEP B E AB: NEGATIVE

## 2016-03-05 LAB — HEPATITIS A ANTIBODY, TOTAL: Hep A Total Ab: NEGATIVE

## 2016-03-05 LAB — ANTI-SMOOTH MUSCLE ANTIBODY, IGG
F-ACTIN AB IGG: 39 U — AB (ref 0–19)
F-Actin IgG: 38 Units — ABNORMAL HIGH (ref 0–19)

## 2016-03-05 LAB — ANA W/REFLEX IF POSITIVE: Anti Nuclear Antibody(ANA): NEGATIVE

## 2016-03-05 LAB — MITOCHONDRIAL ANTIBODIES: Mitochondrial M2 Ab, IgG: 17 Units (ref 0.0–20.0)

## 2016-03-05 LAB — HEPATITIS B CORE ANTIBODY, IGM: Hep B C IgM: NEGATIVE

## 2016-03-05 NOTE — Progress Notes (Signed)
Dr.Sainani rounded on pt at 0905. After this, this writer removed piv #20 from pt's lac with catheter intact, pt tolerated well. Pt dc'd at 41 via wc to waiting family vehicle after receiving d/c instructions.

## 2016-03-05 NOTE — Progress Notes (Signed)
Shift assessment completed. Pt is awake, alert and oriented, in no distress, stated he feels much better but was awake all night. Pt is on room air, lungs are clear, Hr is regular. Abdomen is soft, deep palpation produced no pain. Pt voiding in urinal, urine is lighter today than yesterday and is clear. Ppp, no edema noted. Pt denied nausea. PIV #20 intact to l arm, site is free of redness and swelling. Pt has call bell in reach.

## 2016-03-05 NOTE — Plan of Care (Signed)
Problem: Bowel/Gastric: Goal: Will not experience complications related to bowel motility Outcome: Completed/Met Date Met: 03/05/16 Pt has met all goals for discharge.

## 2016-03-05 NOTE — Discharge Summary (Signed)
[facility] NAME: Jeremiah Price    MR#:  [id number]  DATE OF BIRTH:  04-05-1945  DATE OF ADMISSION:  03/03/2016 ADMITTING PHYSICIAN: Gladstone Lighter, MD  DATE OF DISCHARGE: 03/05/2016 10:34 AM  PRIMARY CARE PHYSICIAN: Placido Sou, MD    ADMISSION DIAGNOSIS:  Hyperbilirubinemia [E80.6] Transaminitis [R74.0] Elevated bilirubin [R17] Pain of upper abdomen [R10.10] Non-intractable vomiting with nausea, unspecified vomiting type [R11.2]  DISCHARGE DIAGNOSIS:  Active Problems:   Elevated LFTs   SECONDARY DIAGNOSIS:   Past Medical History:  Diagnosis Date  . Hypertension   . Hypothyroidism   . Vertigo     HOSPITAL COURSE:   Jeremiah Latimeris a 71 y.o. malewith a known history of Hypertension, hypothyroidism, recent admission 2 weeks ago for Escherichia coli bacteremia and elevated LFTs who was discharged home on Bactrim presents again with 2 day history of nausea, vomiting and epigastric and mid abdominal pain.  #1 Sepsis-recent E.coli bacteremia 2 weeks ago- finished bactrim course.  -Empirically patient was started on IV Rocephin, although his Allises and cultures remained negative. He remained afebrile and hemodynamically stable on the hospital. Sepsis has now been ruled out and therefore he was not discharged on any antibiotics.  #2 Elevated LFTs- similar presentation 2 weeks ago, but had E.coli bacteremia and LFTs were improving at the time of discharge - denies alcohol use, acute hepatitis panel negative recently. [place] and CT abdomen last admission with slightly increased liver echogenicity but no other abnormalities, he is s/p cholecystectomy -Patient underwent an MRCP which showed no evidence of choledocholithiasis, seen by gastroenterology who think this is likely related to the Bactrim that the patient was taking. Patient did have some serologic workup done results of which can be followed up as an outpatient with  gastroenterology. His LFTs were followed and since then have improved and need to be further followed as an outpatient. He denies any abdominal pain and is tolerating a diet well without any nausea vomiting or any other symptoms.  #3 Hyponatremia- mild, resolved w/ IV fluids.  - he will resume his HCTZ upon discharge.   #4 HTN- he will cont. Bisoprolol/HCTZ upon discharge.   #5 Hypothyroidism- he will cont. synthroid  DISCHARGE CONDITIONS:   Stable.   CONSULTS OBTAINED:  Treatment Team:  Lin Landsman, MD Ebbie Ridge, MD  DRUG ALLERGIES:  No Known Allergies  DISCHARGE MEDICATIONS:   Allergies as of 03/05/2016   No Known Allergies     Medication List    TAKE these medications   acetaminophen 500 MG tablet Commonly known as:  TYLENOL Take 500 mg by mouth every 6 (six) hours as needed.   bismuth subsalicylate [id number] suspension Commonly known as:  PEPTO BISMOL Take 30 mLs by mouth every 6 (six) hours as needed.   bisoprolol-hydrochlorothiazide 5-6.25 MG tablet Commonly known as:  ZIAC Take 1 tablet by mouth daily.   famotidine 10 MG chewable tablet Commonly known as:  PEPCID AC Chew 10 mg by mouth 2 (two) times daily.   levothyroxine 50 MCG tablet Commonly known as:  SYNTHROID, LEVOTHROID Take by mouth.         DISCHARGE INSTRUCTIONS:   DIET:  Cardiac diet  DISCHARGE CONDITION:  Stable  ACTIVITY:  Activity as tolerated  OXYGEN:  Home Oxygen: No.   Oxygen Delivery: room air  DISCHARGE LOCATION:  home   If you experience worsening of your admission symptoms, develop shortness of breath, life threatening emergency, suicidal or  homicidal thoughts you must seek medical attention immediately by calling 911 or calling your MD immediately  if symptoms less severe.  You Must read complete instructions/literature along with all the possible adverse reactions/side effects for all the Medicines you take and that have been prescribed to  you. Take any new Medicines after you have completely understood and accpet all the possible adverse reactions/side effects.   Please note  You were cared for by a hospitalist during your hospital stay. If you have any questions about your discharge medications or the care you received while you were in the hospital after you are discharged, you can call the unit and asked to speak with the hospitalist on call if the hospitalist that took care of you is not available. Once you are discharged, your primary care physician will handle any further medical issues. Please note that NO REFILLS for any discharge medications will be authorized once you are discharged, as it is imperative that you return to your primary care physician (or establish a relationship with a primary care physician if you do not have one) for your aftercare needs so that they can reassess your need for medications and monitor your lab values.     Today   No abdominal pain, N/V. LFT's improving. Itching improved and no other acute events overnight.   VITAL SIGNS:  Blood pressure 130/78, pulse 61, temperature 97.6 F (36.4 C), temperature source Oral, resp. rate 16, height 6\' 3"  (1.905 m), weight 96.7 kg (213 lb 3.2 oz), SpO2 98 %.  I/O:   Intake/Output Summary (Last 24 hours) at 03/05/16 1535 Last data filed at 03/05/16 [id number]  Gross per 24 hour  Intake              240 ml  Output             1450 ml  Net            -1210 ml    PHYSICAL EXAMINATION:  GENERAL:  71 y.o.-year-old patient lying in the bed with no acute distress.  EYES: Pupils equal, round, reactive to light and accommodation. No scleral icterus. Extraocular muscles intact.  HEENT: Head atraumatic, normocephalic. Oropharynx and nasopharynx clear.  NECK:  Supple, no jugular venous distention. No thyroid enlargement, no tenderness.  LUNGS: Normal breath sounds bilaterally, no wheezing, rales,rhonchi. No use of accessory muscles of respiration.   CARDIOVASCULAR: S1, S2 normal. [name], rubs, or gallops.  ABDOMEN: Soft, non-tender, non-distended. Bowel sounds present. No organomegaly or mass.  EXTREMITIES: No pedal edema, cyanosis, or clubbing.  NEUROLOGIC: Cranial nerves II through XII are intact. No focal motor or sensory defecits b/l.  PSYCHIATRIC: The patient is alert and oriented x 3. Good affect.  SKIN: No obvious rash, lesion, or ulcer.   DATA REVIEW:   CBC  Recent Labs Lab 03/04/16 0440  WBC 4.3  HGB 12.8*  HCT 36.7*  PLT 361    Chemistries   Recent Labs Lab 03/05/16 0330  NA 134*  K 3.7  CL 103  CO2 25  GLUCOSE 142*  BUN 12  CREATININE 1.03  CALCIUM 9.2  AST 140*  ALT 381*  ALKPHOS 292*  BILITOT 1.4*    Cardiac Enzymes  Recent Labs Lab 03/03/16 0934  TROPONINI <0.03    Microbiology Results  Results for orders placed or performed during the hospital encounter of 03/03/16  Blood culture (routine x 2)     Status: None (Preliminary result)   Collection Time: 03/03/16 11:41 AM  Result Value Ref Range Status   Specimen Description BLOOD LEFT ASSIST CONTROL  Final   Special Requests BOTTLES DRAWN AEROBIC AND ANAEROBIC ANA9ML AER12ML  Final   Culture NO GROWTH 2 DAYS  Final   Report Status PENDING  Incomplete  Blood culture (routine x 2)     Status: None (Preliminary result)   Collection Time: 03/03/16 11:41 AM  Result Value Ref Range Status   Specimen Description BLOOD RIGHT ASSIST CONTROL  Final   Special Requests BOTTLES DRAWN AEROBIC AND ANAEROBIC ANA10 AER12ML  Final   Culture NO GROWTH 2 DAYS  Final   Report Status PENDING  Incomplete    RADIOLOGY:  Mr Abdomen Mrcp Wo Contrast  Result Date: 03/04/2016 CLINICAL DATA:  Epigastric pain. EXAM: MRI ABDOMEN WITHOUT CONTRAST  (INCLUDING MRCP) TECHNIQUE: Multiplanar multisequence MR imaging of the abdomen was performed. Heavily T2-weighted images of the biliary and pancreatic ducts were obtained, and three-dimensional MRCP images were  rendered by post processing. COMPARISON:  CT scan 02/16/2016 FINDINGS: Lower chest:  Unremarkable. Hepatobiliary: No focal abnormality identified within the liver parenchyma. Liver gallbladder surgically absent. No intrahepatic Biliary duct dilatation. Mild distention of the proximal common duct up to 11 mm diameter, but mid and distal common duct and common bile duct are normal diameter. No intraluminal filling defect to suggest choledocholithiasis. No abrupt stricture. Pancreas: No focal mass lesion. No dilatation of the main duct. No intraparenchymal cyst. No peripancreatic edema. Spleen: No splenomegaly. No focal mass lesion. Adrenals/Urinary Tract: No adrenal nodule or mass. Several tiny cortical cysts are noted in the kidneys bilaterally. No hydronephrosis. No enhancing renal mass. Adrenal glands are normal. Stomach/Bowel: Stomach and duodenum unremarkable. No small bowel or colon dilatation in the visualized abdomen. Vascular/Lymphatic: Infrarenal abdominal aortic aneurysm not fully characterized on axial imaging today and better seen on the previous CT scan. There is no gastrohepatic or hepatoduodenal ligament lymphadenopathy. No intraperitoneal or retroperitoneal lymphadenopathy. Other: No intraperitoneal free fluid. Musculoskeletal: No abnormal marrow enhancement within the visualized bony anatomy. IMPRESSION: 1. Mild prominence of the extrahepatic common duct without intrahepatic or distal common bile duct dilatation. No choledocholithiasis. 2. Abdominal aortic aneurysm better seen on the recent CT scan. 3. Tiny bilateral renal cysts. Electronically Signed   By: Misty Stanley M.D.   On: 03/04/2016 13:06      Management plans discussed with the patient, family and they are in agreement.  CODE STATUS:  Code Status History    Date Active Date Inactive Code Status Order ID Comments User Context   03/03/2016 12:51 PM 03/05/2016  1:39 PM Full Code [id number]  Gladstone Lighter, MD Inpatient   02/17/2016   2:36 AM 02/19/2016  6:39 PM Full Code [id number]  Lance Coon, MD Inpatient    Advance Directive Documentation   Flowsheet Row Most Recent Value  Type of Advance Directive  Healthcare Power of Attorney, Living will  Pre-existing out of facility DNR order (yellow form or pink MOST form)  No data  "MOST" Form in Place?  No data      TOTAL TIME TAKING CARE OF THIS PATIENT: 40 minutes.    Henreitta Leber M.D on 03/05/2016 at 3:35 PM  Between 7am to 6pm - Pager - [phone]  After 6pm go to [email or web address] - Proofreader  [facility]  Office  [phone]  CC: Primary care physician; Placido Sou, MD

## 2016-03-06 LAB — CELIAC DISEASE PANEL
Endomysial Ab, IgA: NEGATIVE
IGA: 264 mg/dL (ref 61–437)

## 2016-03-06 LAB — HSV(HERPES SIMPLEX VRS) I + II AB-IGM: HSVI/II Comb IgM: <0.91 Ratio (ref 0.00–0.90)

## 2016-03-06 LAB — CMV DNA BY PCR, QUALITATIVE: CMV DNA, Qual PCR: NEGATIVE

## 2016-03-07 ENCOUNTER — Telehealth: Payer: Self-pay

## 2016-03-07 ENCOUNTER — Other Ambulatory Visit: Payer: Self-pay

## 2016-03-07 DIAGNOSIS — I10 Essential (primary) hypertension: Secondary | ICD-10-CM | POA: Diagnosis not present

## 2016-03-07 DIAGNOSIS — K769 Liver disease, unspecified: Secondary | ICD-10-CM

## 2016-03-07 DIAGNOSIS — R945 Abnormal results of liver function studies: Secondary | ICD-10-CM

## 2016-03-07 DIAGNOSIS — E039 Hypothyroidism, unspecified: Secondary | ICD-10-CM | POA: Diagnosis not present

## 2016-03-07 DIAGNOSIS — R740 Nonspecific elevation of levels of transaminase and lactic acid dehydrogenase [LDH]: Secondary | ICD-10-CM | POA: Diagnosis not present

## 2016-03-07 DIAGNOSIS — R7989 Other specified abnormal findings of blood chemistry: Secondary | ICD-10-CM

## 2016-03-07 DIAGNOSIS — E782 Mixed hyperlipidemia: Secondary | ICD-10-CM | POA: Diagnosis not present

## 2016-03-07 NOTE — Telephone Encounter (Signed)
-----   Message from [name], MD sent at 03/06/2016 11:32 AM EST ----- Regarding: appointment  Please inform patient that he definetly needs to see me early next week with repeat hepatic function, INR prior to visit. I also want him to have LFT's and INR tomorrow if not already scheduled    Dr [name]  Gastroenterology/Hepatology Pager: [phone]

## 2016-03-08 LAB — HEMOCHROMATOSIS DNA-PCR(C282Y,H63D)

## 2016-03-08 LAB — CULTURE, BLOOD (ROUTINE X 2)
CULTURE: NO GROWTH
CULTURE: NO GROWTH

## 2016-03-12 ENCOUNTER — Telehealth: Payer: Self-pay

## 2016-03-12 LAB — MISC LABCORP TEST (SEND OUT): Labcorp test code: 820186

## 2016-03-12 NOTE — Telephone Encounter (Signed)
Spoke to Jeremiah Price and he states that he's doing ok right now. He does not wish to keep any appointments or have any additional lab work to check his liver function.   Per Dr. Vicente Males a letter was mailed to both the patient and PCP stating this information.

## 2016-03-12 NOTE — Telephone Encounter (Signed)
-----   Message from [name], MD sent at 03/12/2016 12:53 PM EST ----- Does he have an appoitnment to see me . He has not obtained any repeat LFT's and INR .Please inform to do so ASAP  Dr [name]  Gastroenterology/Hepatology Pager: [phone]

## 2016-04-02 ENCOUNTER — Ambulatory Visit: Payer: Medicare Other | Admitting: Gastroenterology

## 2016-07-04 DIAGNOSIS — E039 Hypothyroidism, unspecified: Secondary | ICD-10-CM | POA: Diagnosis not present

## 2016-07-04 DIAGNOSIS — I10 Essential (primary) hypertension: Secondary | ICD-10-CM | POA: Diagnosis not present

## 2016-07-04 DIAGNOSIS — R7301 Impaired fasting glucose: Secondary | ICD-10-CM | POA: Diagnosis not present

## 2016-07-31 DIAGNOSIS — H40003 Preglaucoma, unspecified, bilateral: Secondary | ICD-10-CM | POA: Diagnosis not present

## 2016-09-27 DIAGNOSIS — E039 Hypothyroidism, unspecified: Secondary | ICD-10-CM | POA: Diagnosis not present

## 2016-09-27 DIAGNOSIS — K4090 Unilateral inguinal hernia, without obstruction or gangrene, not specified as recurrent: Secondary | ICD-10-CM | POA: Diagnosis not present

## 2016-09-27 DIAGNOSIS — I10 Essential (primary) hypertension: Secondary | ICD-10-CM | POA: Diagnosis not present

## 2016-09-27 DIAGNOSIS — F0390 Unspecified dementia without behavioral disturbance: Secondary | ICD-10-CM | POA: Diagnosis not present

## 2016-09-27 DIAGNOSIS — Z0001 Encounter for general adult medical examination with abnormal findings: Secondary | ICD-10-CM | POA: Diagnosis not present

## 2016-10-09 ENCOUNTER — Other Ambulatory Visit: Payer: Self-pay | Admitting: Family Medicine

## 2016-10-09 DIAGNOSIS — F0390 Unspecified dementia without behavioral disturbance: Secondary | ICD-10-CM

## 2016-10-16 ENCOUNTER — Ambulatory Visit
Admission: RE | Admit: 2016-10-16 | Discharge: 2016-10-16 | Disposition: A | Discharge status: Home / Self Care | Payer: Medicare Other | Source: Ambulatory Visit | Attending: Family Medicine | Admitting: Family Medicine

## 2016-10-16 DIAGNOSIS — R413 Other amnesia: Secondary | ICD-10-CM | POA: Diagnosis not present

## 2016-10-16 DIAGNOSIS — D181 Lymphangioma, any site: Secondary | ICD-10-CM | POA: Insufficient documentation

## 2016-10-16 DIAGNOSIS — G319 Degenerative disease of nervous system, unspecified: Secondary | ICD-10-CM | POA: Diagnosis not present

## 2016-10-16 DIAGNOSIS — F0390 Unspecified dementia without behavioral disturbance: Secondary | ICD-10-CM | POA: Insufficient documentation

## 2017-04-24 ENCOUNTER — Other Ambulatory Visit: Payer: Self-pay | Admitting: Internal Medicine

## 2017-06-03 ENCOUNTER — Other Ambulatory Visit: Payer: Self-pay | Admitting: Internal Medicine

## 2017-07-24 IMAGING — US US ABDOMEN LIMITED
1 series · 13 of 25 positions shown · non-contrast
Comparison: None.

CLINICAL DATA: Elevated liver enzymes

EXAM:
DUPLEX ULTRASOUND OF LIVER ; ultrasound right upper quadrant
TECHNIQUE: Color and duplex Doppler ultrasound was performed to evaluate the
hepatic in-flow and out-flow vessels. Ultrasound right upper
quadrant also obtained.

[Series 1: us abdomen limited · 0.31mm/px · 13 of 57 slices shown]
[im 1/57]
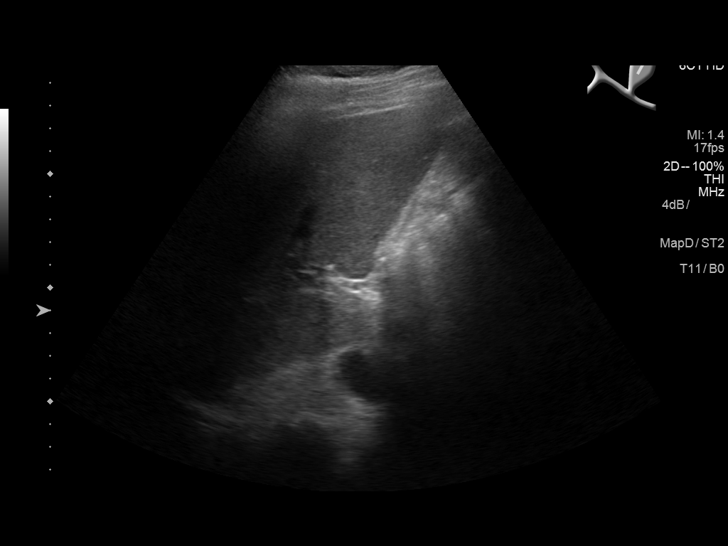
[im 5/57]
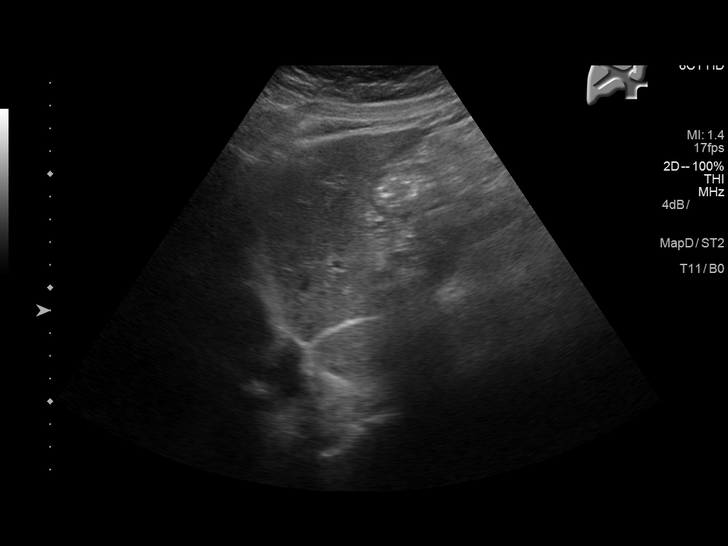
[im 10/57]
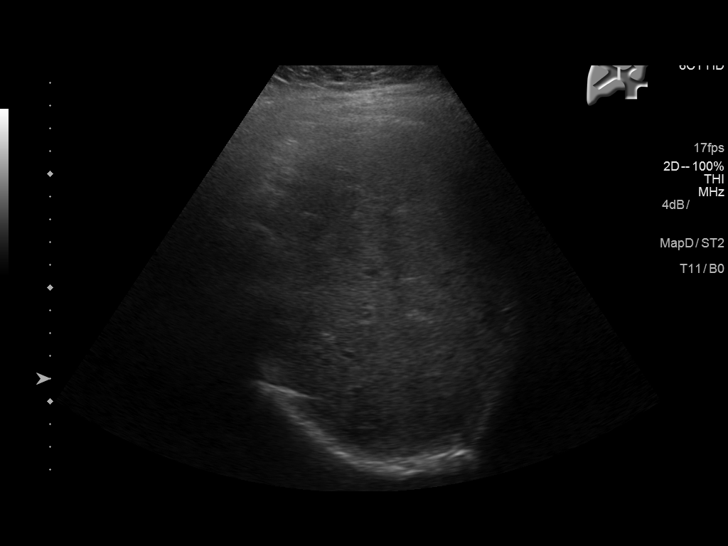
[im 15/57]
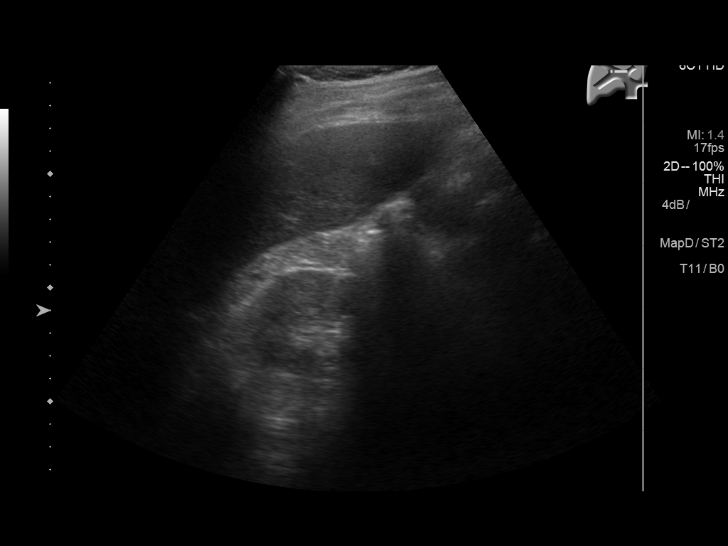
[im 19/57]
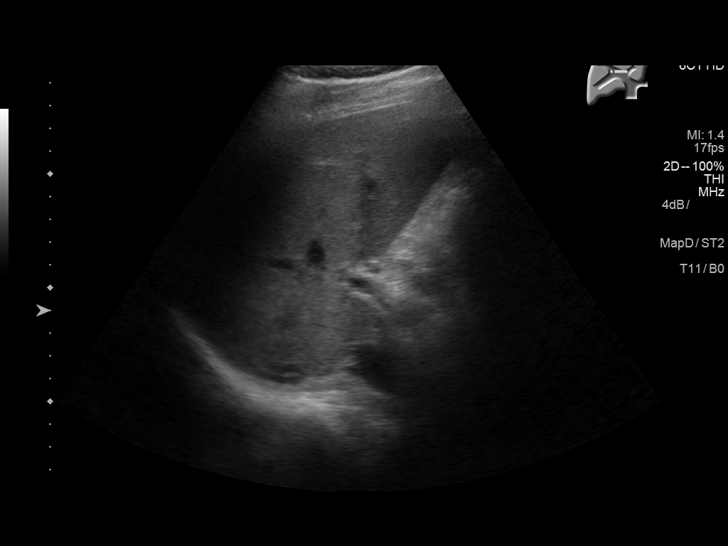
[im 24/57]
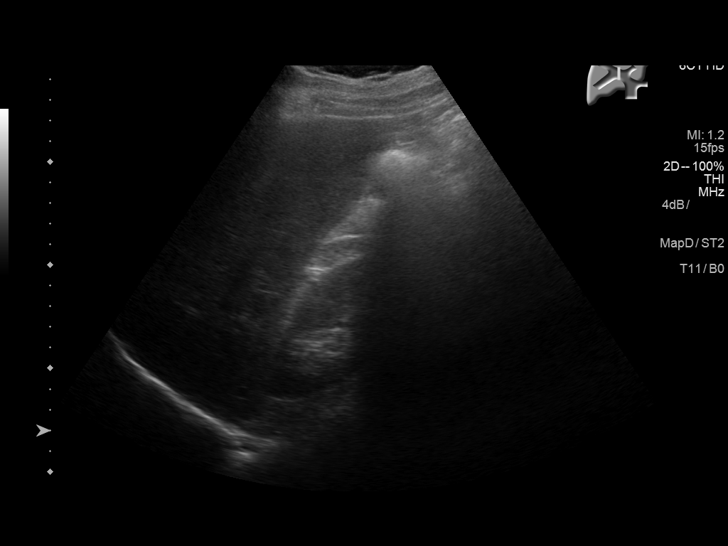
[im 29/57]
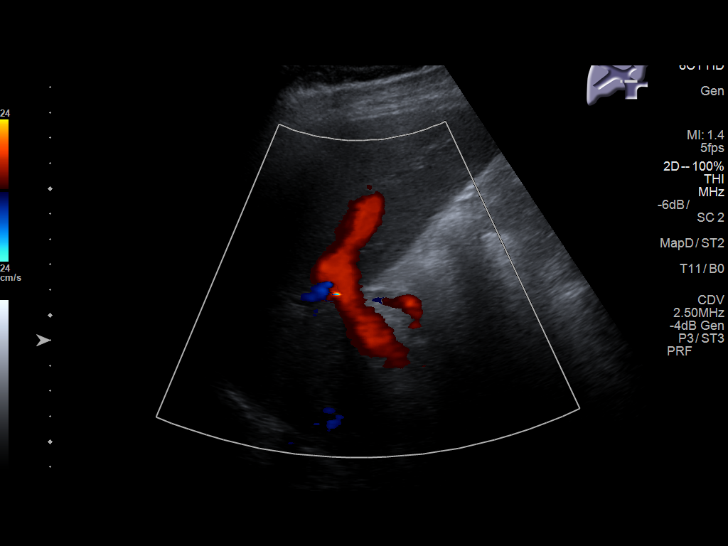
[im 33/57]
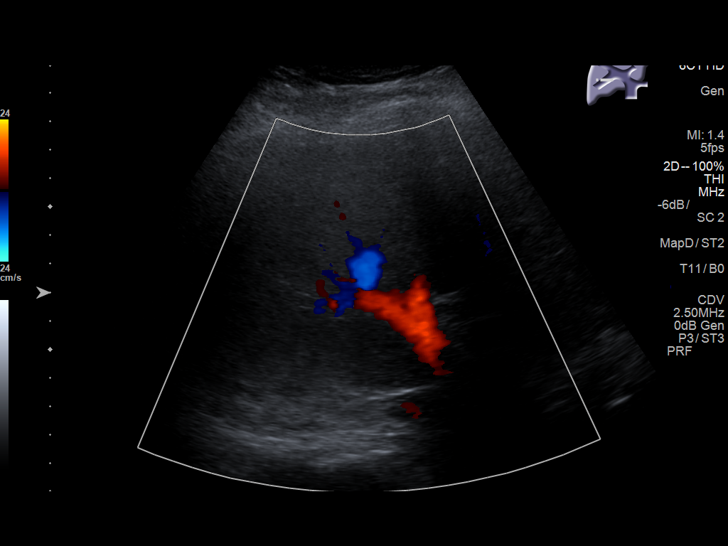
[im 38/57]
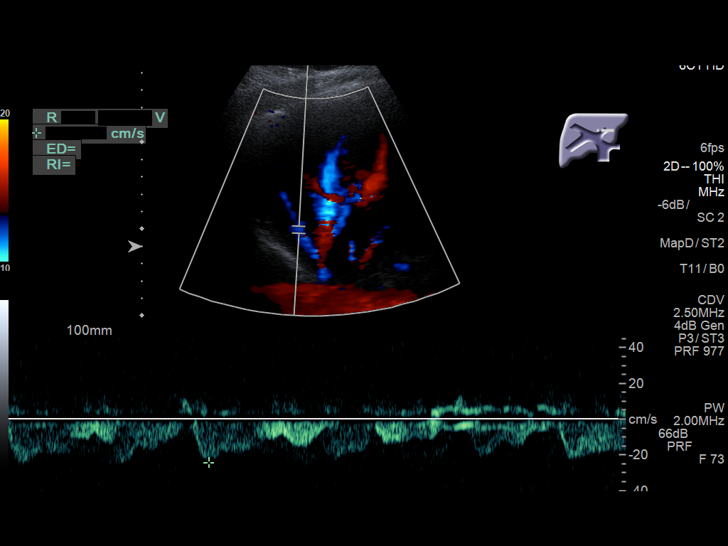
[im 43/57]
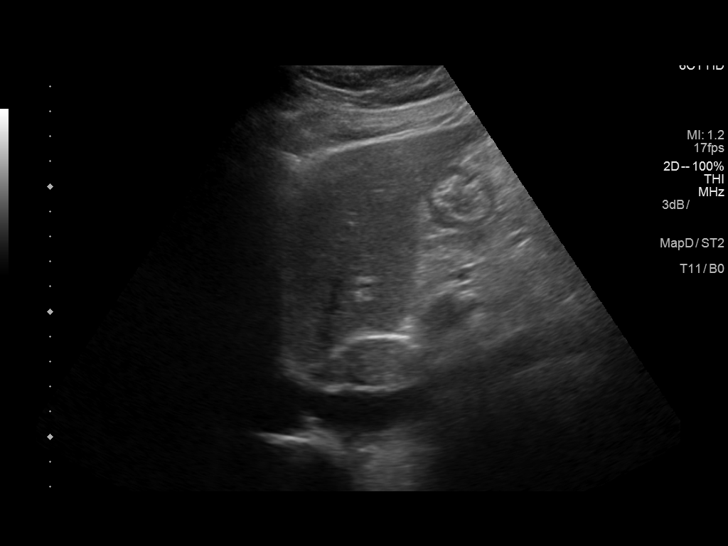
[im 47/57]
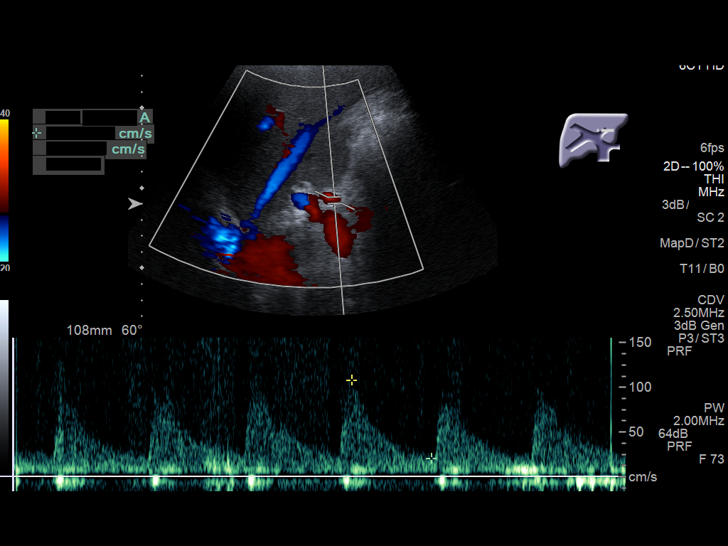
[im 52/57]
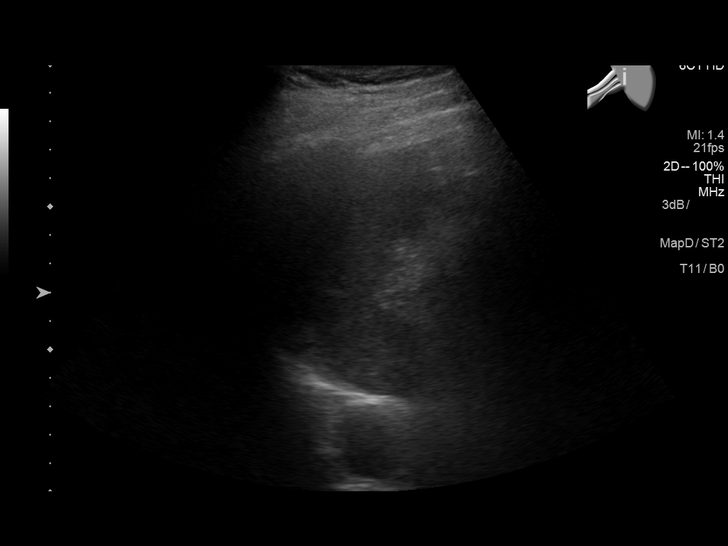
[im 57/57]
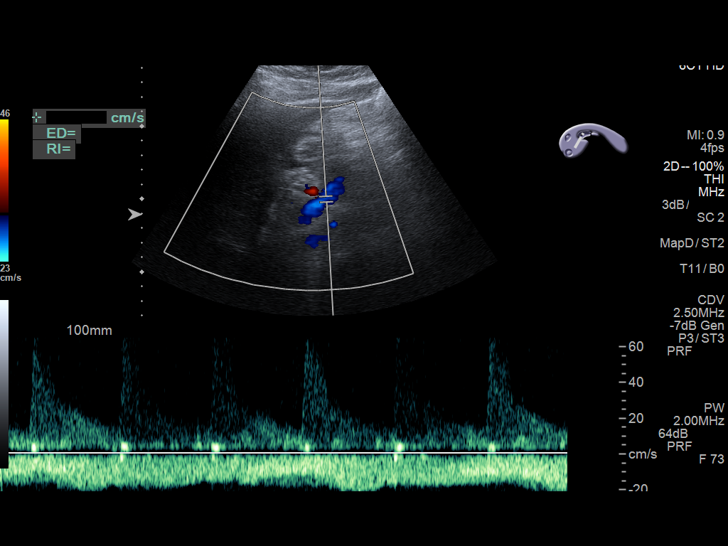

[13 of 25 positions shown; findings below may reference images not displayed]

FINDINGS: Portal Vein Velocities

Main: 31.8 cm/sec proximally ; 32.6 centimeter/second mid ;
centimeter/second distally

Right:  25.1 cm/sec

Left:  23.2 cm/sec

Flow is in the anatomic direction for these vessels.

Hepatic Vein Velocities

Right:  24.3 cm/sec

Middle:  25.8 cm/sec

Left:  35.8 cm/sec

Flow in these vessels is in the anatomic direction.

Hepatic Artery Velocity:  108.2 Cm/sec

Splenic Vein Velocity:  16.7 cm/sec

Varices: None

Ascites: None

Spleen measures 5.5 x 9.0 x 4.0 cm without focal splenic lesion
evident. No portal or hepatic vein occlusion of thrombus.
IMPRESSION: Portal, hepatic, and splenic veins are patent with flow respective
anatomic directions. Hepatic artery patent with normal waveform. No
thrombus, ascites, or varices evident by ultrasound.

Ultrasound right upper quadrant:

Gallbladder:  Gallbladder is absent.

Common bile duct: 5 mm diameter. No intrahepatic or extrahepatic
biliary duct dilatation.

Liver: No focal liver lesions evident. Note that the echogenicity of
the liver is overall increased.
IMPRESSION: Gallbladder absent. Increased liver echogenicity. While
no focal liver lesions are evident, it must be cautioned that the
sensitivity of ultrasound for detection of focal liver lesions is
diminished in this circumstance.

## 2018-01-06 ENCOUNTER — Other Ambulatory Visit: Payer: Self-pay | Admitting: Internal Medicine

## 2018-01-08 ENCOUNTER — Other Ambulatory Visit: Payer: Self-pay | Admitting: Internal Medicine

## 2018-01-12 ENCOUNTER — Ambulatory Visit: Payer: Medicare Other | Admitting: Nurse Practitioner

## 2018-01-12 ENCOUNTER — Encounter: Payer: Self-pay | Admitting: Nurse Practitioner

## 2018-01-12 VITALS — BP 142/80 | HR 60 | Resp 16 | Ht 75.5 in | Wt 228.6 lb

## 2018-01-12 DIAGNOSIS — Z1211 Encounter for screening for malignant neoplasm of colon: Secondary | ICD-10-CM

## 2018-01-12 DIAGNOSIS — E039 Hypothyroidism, unspecified: Secondary | ICD-10-CM

## 2018-01-12 DIAGNOSIS — I10 Essential (primary) hypertension: Secondary | ICD-10-CM

## 2018-01-12 MED ORDER — BISOPROLOL-HYDROCHLOROTHIAZIDE 5-6.25 MG PO TABS: 1.0000 | ORAL_TABLET | Freq: Every day | ORAL | 3 refills | Status: DC

## 2018-01-12 MED ORDER — LEVOTHYROXINE SODIUM 50 MCG PO TABS: 50.0000 ug | ORAL_TABLET | Freq: Every day | ORAL | 3 refills | Status: DC

## 2018-01-12 NOTE — Progress Notes (Signed)
[facility] [address] [place], [place] [address]  Internal MEDICINE  Office Visit Note  Patient Name: Jeremiah Price  [id number]  [id number]  Date of Service: 01/14/2018  Chief Complaint  Patient presents with  . Medical Management of Chronic Issues    medication follow up levothyroxine, pt has a hernia  . Quality Metric Gaps    pt does not plan on getting the flu vaccine or pneumonia vaccine    The patient is here for routine follow up visit. Has been a little over a year since his last visit. Has run out of his thyroid medication. States that he has been out for a few weeks. Still taking blood pressure medication every day. Needs to have refills. He is due to have routine, fasting labs checked. He is also due for colon cancer screening.       Current Medication: Outpatient Encounter Medications as of 01/12/2018  Medication Sig Note  . acetaminophen (TYLENOL) 500 MG tablet Take 500 mg by mouth every 6 (six) hours as needed.   . bismuth subsalicylate (PEPTO BISMOL) 262 MG/15ML suspension Take 30 mLs by mouth every 6 (six) hours as needed. 03/03/2016: PRN  . bisoprolol-hydrochlorothiazide (ZIAC) 5-6.25 MG tablet Take 1 tablet by mouth daily.   . famotidine (PEPCID AC) 10 MG chewable tablet Chew 10 mg by mouth 2 (two) times daily. 03/03/2016: PRN  . levothyroxine (SYNTHROID, LEVOTHROID) 50 MCG tablet Take 1 tablet (50 mcg total) by mouth daily before breakfast.   . [DISCONTINUED] bisoprolol-hydrochlorothiazide (ZIAC) 5-6.25 MG tablet TAKE 1 TABLET BY MOUTH ONCE DAILY   . [DISCONTINUED] levothyroxine (SYNTHROID, LEVOTHROID) 50 MCG tablet TAKE 1 TABLET BY MOUTH IN THE MORNING 30 MINUTES BEFORE FOOD    No facility-administered encounter medications on file as of 01/12/2018.     Surgical History: Past Surgical History:  Procedure Laterality Date  . CHOLECYSTECTOMY      Medical History: Past Medical History:  Diagnosis Date  . Hypertension   . Hypothyroidism   .  Vertigo     Family History: Family History  Problem Relation Age of Onset  . CAD Neg Hx     Social History   Socioeconomic History  . Marital status: Married    Spouse name: Not on file  . Number of children: Not on file  . Years of education: Not on file  . Highest education level: Not on file  Occupational History  . Not on file  Social Needs  . Financial resource strain: Not on file  . Food insecurity:    Worry: Not on file    Inability: Not on file  . Transportation needs:    Medical: Not on file    Non-medical: Not on file  Tobacco Use  . Smoking status: Former Research scientist (life sciences)  . Smokeless tobacco: Never Used  . Tobacco comment: quit >30 years ago  Substance and Sexual Activity  . Alcohol use: Yes    Comment: occasionally  . Drug use: No    Comment: Quit >40 years ago  . Sexual activity: Not on file  Lifestyle  . Physical activity:    Days per week: Not on file    Minutes per session: Not on file  . Stress: Not on file  Relationships  . Social connections:    Talks on phone: Not on file    Gets together: Not on file    Attends religious service: Not on file    Active member of club or organization: Not on file  Attends meetings of clubs or organizations: Not on file    Relationship status: Not on file  . Intimate partner violence:    Fear of current or ex partner: Not on file    Emotionally abused: Not on file    Physically abused: Not on file    Forced sexual activity: Not on file  Other Topics Concern  . Not on file  Social History Narrative   Active and independent at baseline      Review of Systems  Constitutional: Negative for activity change, chills, fatigue and unexpected weight change.  HENT: Negative for congestion, rhinorrhea, sneezing and sore throat.   Respiratory: Negative for cough, chest tightness and shortness of breath.   Cardiovascular: Negative for chest pain and palpitations.  Gastrointestinal: Negative for abdominal pain,  constipation, diarrhea, nausea and vomiting.  Endocrine: Negative for cold intolerance, heat intolerance, polydipsia and polyuria.       Patient on levothyroxine but has been out for a few weeks.   Musculoskeletal: Negative for arthralgias, back pain, joint swelling and neck pain.  Skin: Negative for rash.  Allergic/Immunologic: Negative for environmental allergies.  Neurological: Negative for dizziness, tremors, numbness and headaches.  Hematological: Negative for adenopathy. Does not bruise/bleed easily.  Psychiatric/Behavioral: Negative for behavioral problems (Depression), sleep disturbance and suicidal ideas. The patient is not nervous/anxious.     Today's Vitals   01/12/18 1518  BP: (!) 142/80  Pulse: 60  Resp: 16  SpO2: 98%  Weight: 228 lb 9.6 oz (103.7 kg)  Height: 6' 3.5" (1.918 m)    Physical Exam Vitals signs and nursing note reviewed.  Constitutional:      General: He is not in acute distress.    Appearance: Normal appearance. He is well-developed. He is not diaphoretic.  HENT:     Head: Normocephalic and atraumatic.     Mouth/Throat:     Pharynx: No oropharyngeal exudate.  Eyes:     Conjunctiva/sclera: Conjunctivae normal.     Pupils: Pupils are equal, round, and reactive to light.  Neck:     Musculoskeletal: Normal range of motion and neck supple.     Thyroid: No thyromegaly.     Vascular: No carotid bruit or JVD.     Trachea: No tracheal deviation.  Cardiovascular:     Rate and Rhythm: Normal rate and regular rhythm.     Heart sounds: Normal heart sounds. No murmur. No friction rub. No gallop.   Pulmonary:     Effort: Pulmonary effort is normal. No respiratory distress.     Breath sounds: Normal breath sounds. [name] or rales.  Chest:     Chest wall: No tenderness.  Abdominal:     General: Bowel sounds are normal.     Palpations: Abdomen is soft.  Musculoskeletal: Normal range of motion.  Lymphadenopathy:     Cervical: No cervical adenopathy.   Skin:    General: Skin is warm and dry.     Capillary Refill: Capillary refill takes less than 2 seconds.  Neurological:     General: No focal deficit present.     Mental Status: He is alert and oriented to person, place, and time.     Cranial Nerves: No cranial nerve deficit.  Psychiatric:        Behavior: Behavior normal.        Thought Content: Thought content normal.        Judgment: Judgment normal.   Assessment/Plan: 1. Essential hypertension Stable. Continue bp medication as prescribed. Routine,  fasting labs ordered.  - CBC with Differential/Platelet - Comprehensive metabolic panel - Lipid panel - bisoprolol-hydrochlorothiazide (ZIAC) 5-6.25 MG tablet; Take 1 tablet by mouth daily.  Dispense: 90 tablet; Refill: 3  2. Acquired hypothyroidism Check thyroid panel and adjust levothyroxine as indicated. Refills levothyroxine provided today.  - TSH - levothyroxine (SYNTHROID, LEVOTHROID) 50 MCG tablet; Take 1 tablet (50 mcg total) by mouth daily before breakfast.  Dispense: 90 tablet; Refill: 3  3. Screening for colon cancer - Fecal occult blood, imunochemical  General Counseling: Josemanuel verbalizes understanding of the findings of todays visit and agrees with plan of treatment. I have discussed any further diagnostic evaluation that may be needed or ordered today. We also reviewed his medications today. he has been encouraged to call the office with any questions or concerns that should arise related to todays visit.  Hypertension Counseling:   The following hypertensive lifestyle modification were recommended and discussed:  1. Limiting alcohol intake to less than 1 oz/day of ethanol:(24 oz of beer or 8 oz of wine or 2 oz of 100-proof whiskey). 2. Take baby ASA 81 mg daily. 3. Importance of regular aerobic exercise and losing weight. 4. Reduce dietary saturated fat and cholesterol intake for overall cardiovascular health. 5. Maintaining adequate dietary potassium, calcium,  and magnesium intake. 6. Regular monitoring of the blood pressure. 7. Reduce sodium intake to less than 100 mmol/day (less than 2.3 gm of sodium or less than 6 gm of sodium choride)   This patient was seen by [place] with Dr Lavera Guise as a part of collaborative care agreement  Orders Placed This Encounter  Procedures  . Fecal occult blood, imunochemical  . CBC with Differential/Platelet  . Comprehensive metabolic panel  . Lipid panel  . TSH    Meds ordered this encounter  Medications  . bisoprolol-hydrochlorothiazide (ZIAC) 5-6.25 MG tablet    Sig: Take 1 tablet by mouth daily.    Dispense:  90 tablet    Refill:  3    Order Specific Question:   Supervising Provider    Answer:   Lavera Guise [[id number]]  . levothyroxine (SYNTHROID, LEVOTHROID) 50 MCG tablet    Sig: Take 1 tablet (50 mcg total) by mouth daily before breakfast.    Dispense:  90 tablet    Refill:  3    Order Specific Question:   Supervising Provider    Answer:   Lavera Guise [[id number]]    Time spent: 75 Minutes      Dr Lavera Guise Internal medicine

## 2018-01-14 DIAGNOSIS — Z1211 Encounter for screening for malignant neoplasm of colon: Secondary | ICD-10-CM | POA: Insufficient documentation

## 2018-02-18 IMAGING — CT CT HEAD W/O CM
3 series · 15 of 47 positions shown, 18 images · non-contrast
Comparison: MRI brain 10/26/2012.  CT head 10/24/2012.

CLINICAL DATA: Dizziness, abdominal pain, and chills since
yesterday. Nausea. Fever. Tachycardia.

EXAM:
CT HEAD WITHOUT CONTRAST
TECHNIQUE: Contiguous axial images were obtained from the base of the skull
through the vertex without intravenous contrast.

[Series 2: head wo · axial · 0.46mm/px · z∈[+473,+598]mm · 9 of 31 slices shown, 12 images]
[im 3/31  brain]
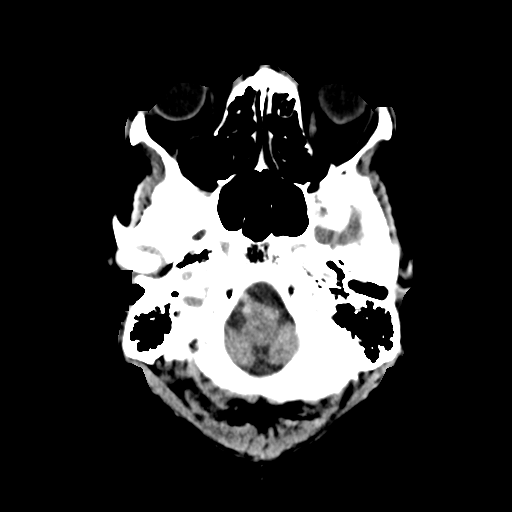
[im 3/31  bone]
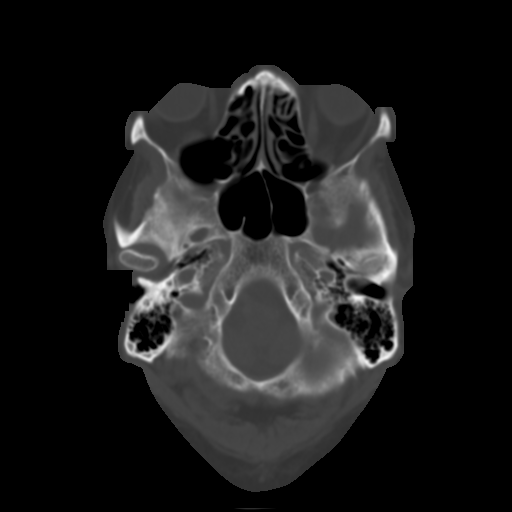
[im 6/31  brain]
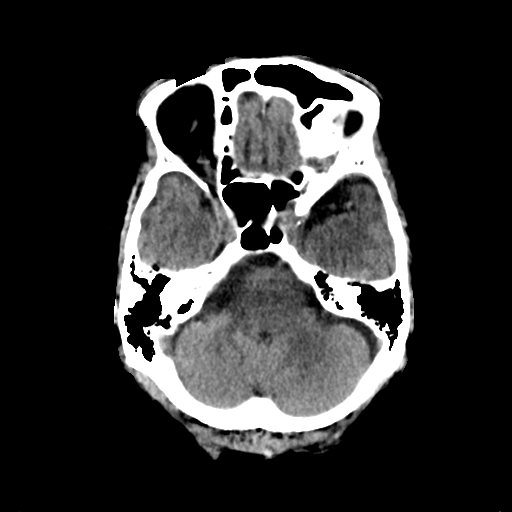
[im 9/31  brain]
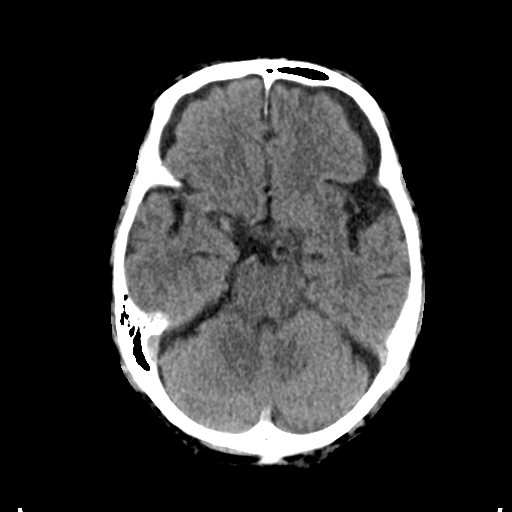
[im 12/31  brain]
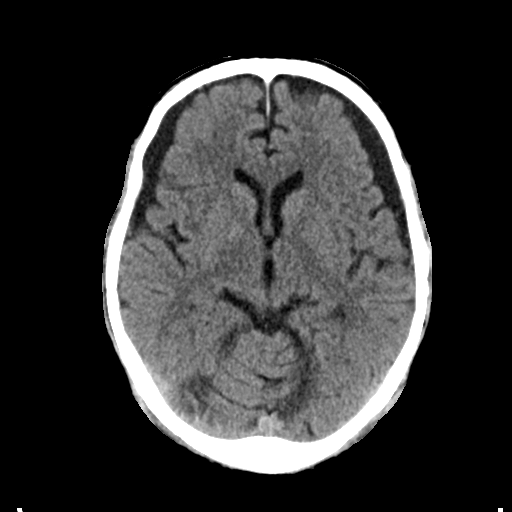
[im 16/31  brain]
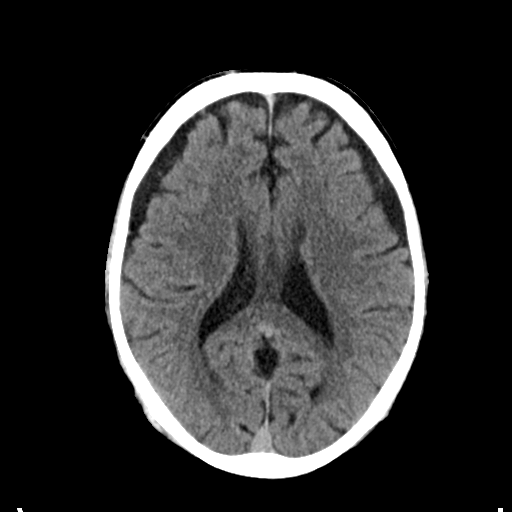
[im 16/31  bone]
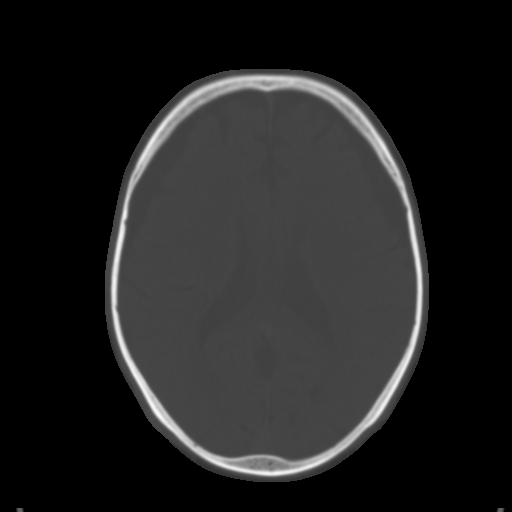
[im 19/31  brain]
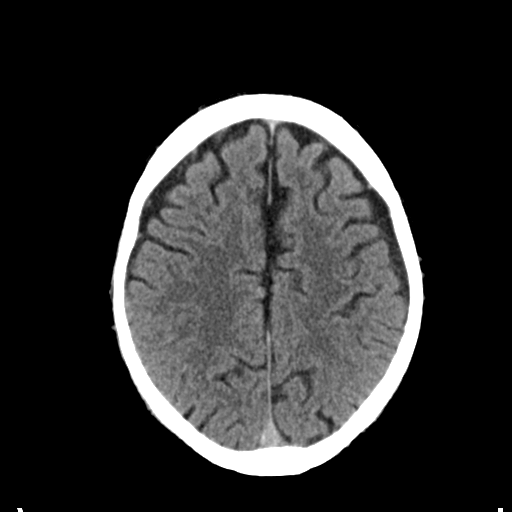
[im 22/31  brain]
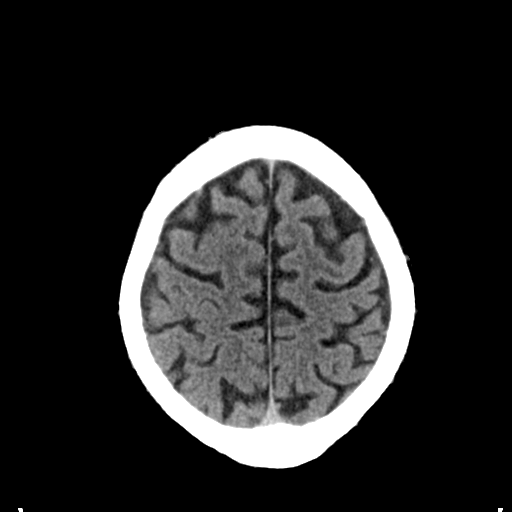
[im 25/31  brain]
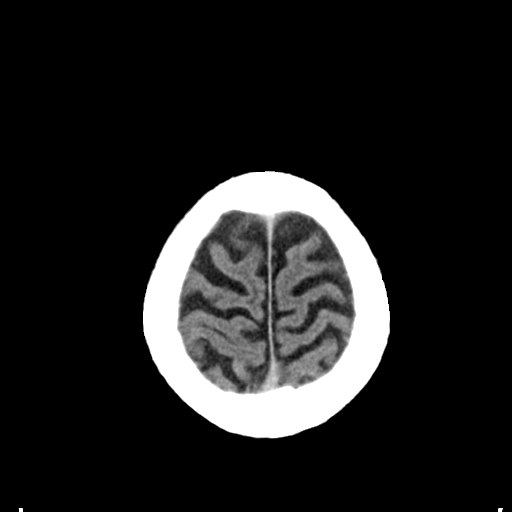
[im 28/31  brain]
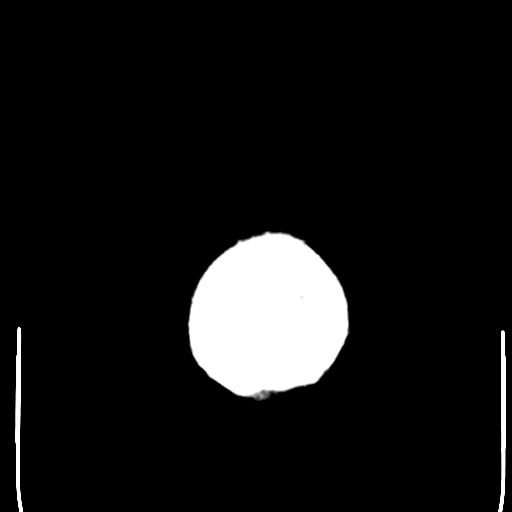
[im 28/31  bone]
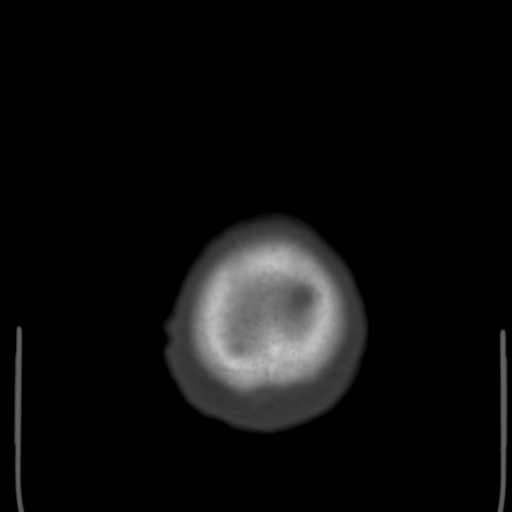

[Series 4: coronal soft tissue · coronal · 0.32mm/px · 3 of 66 slices shown]
[im 22/66  brain]
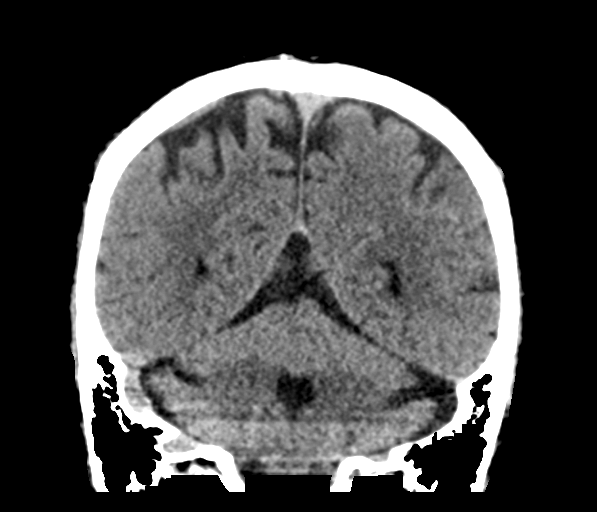
[im 29/66  brain]
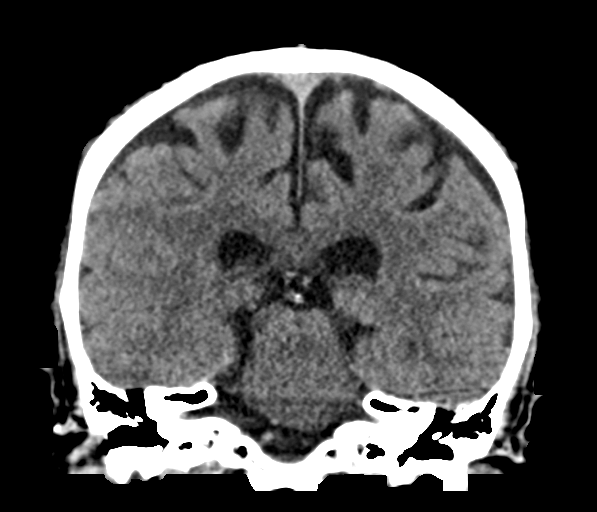
[im 37/66  brain]
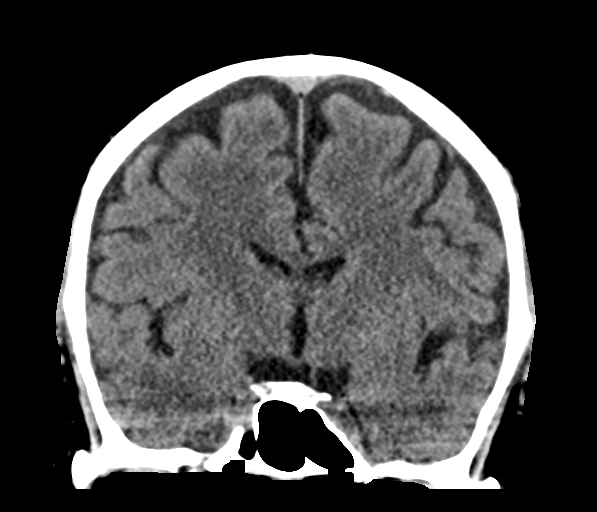

[Series 5: sagittal soft tissue · sagittal · 0.29mm/px · 3 of 56 slices shown]
[im 19/56  brain]
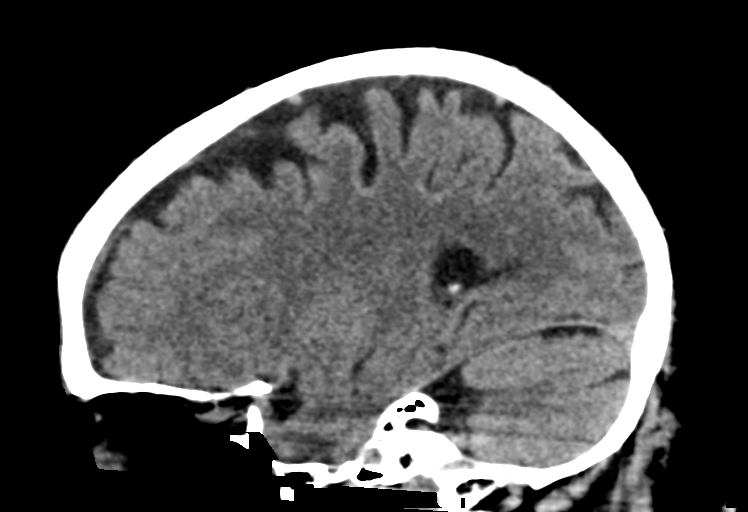
[im 28/56  brain]
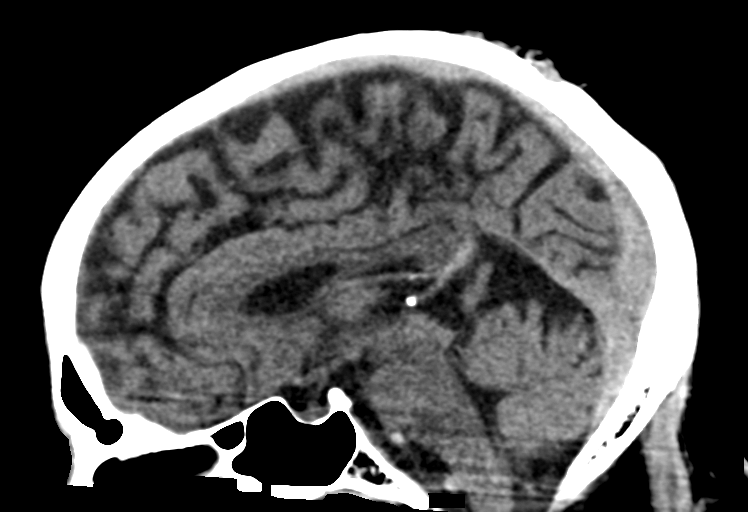
[im 37/56  brain]
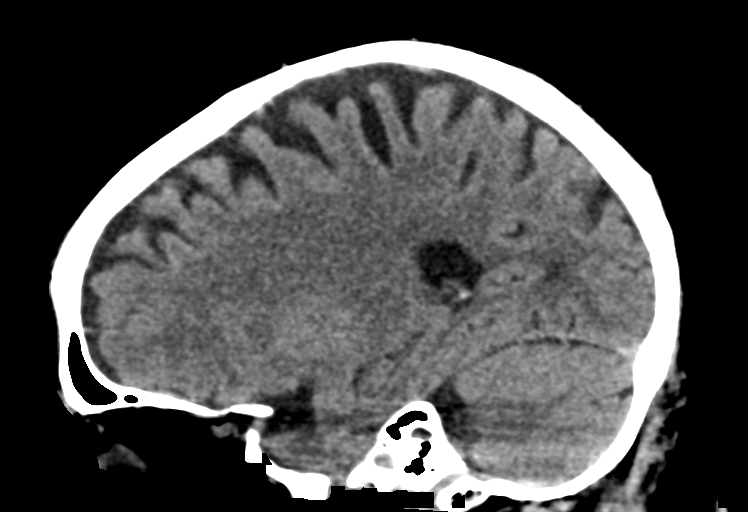

[15 of 47 positions shown; findings below may reference images not displayed]

FINDINGS: Brain: Diffuse cerebral atrophy. Small bilateral chronic subdural
hygromas. No mass effect or midline shift. No abnormal extra-axial
fluid collections. Gray-white matter junctions are distinct. Basal
cisterns are not effaced. No ventricular dilatation. No evidence of
acute intracranial hemorrhage.

Vascular: No hyperdense vessel or unexpected calcification.

Skull: Normal. Negative for fracture or focal lesion.

Sinuses/Orbits: No acute finding.

Other: No significant changes since prior study.
IMPRESSION: No acute intracranial abnormalities.

## 2018-02-18 IMAGING — CR DG CHEST 2V
1 series · 3 of 3 positions shown · non-contrast
Comparison: 10/24/2012

CLINICAL DATA: Dizziness, abdominal pain, and chills since
yesterday.

EXAM:
CHEST  2 VIEW

[Series 1: w chest lat · 0.14mm/px · 3 of 3 slices shown]
[im 1/3]
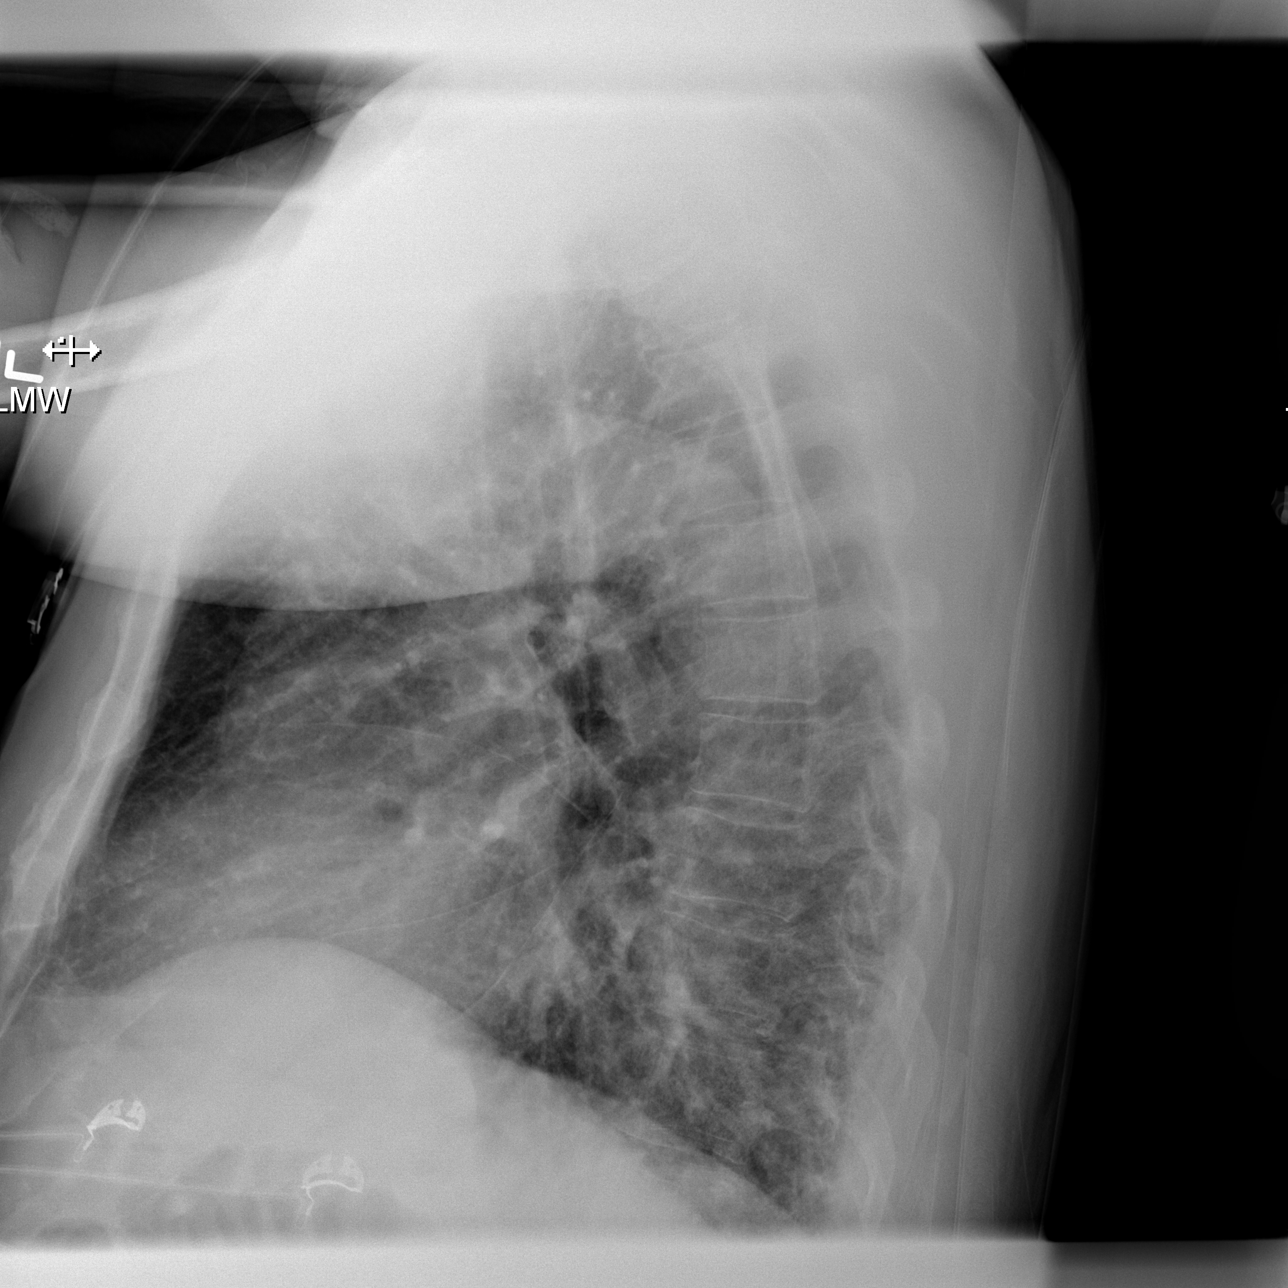
[im 2/3]
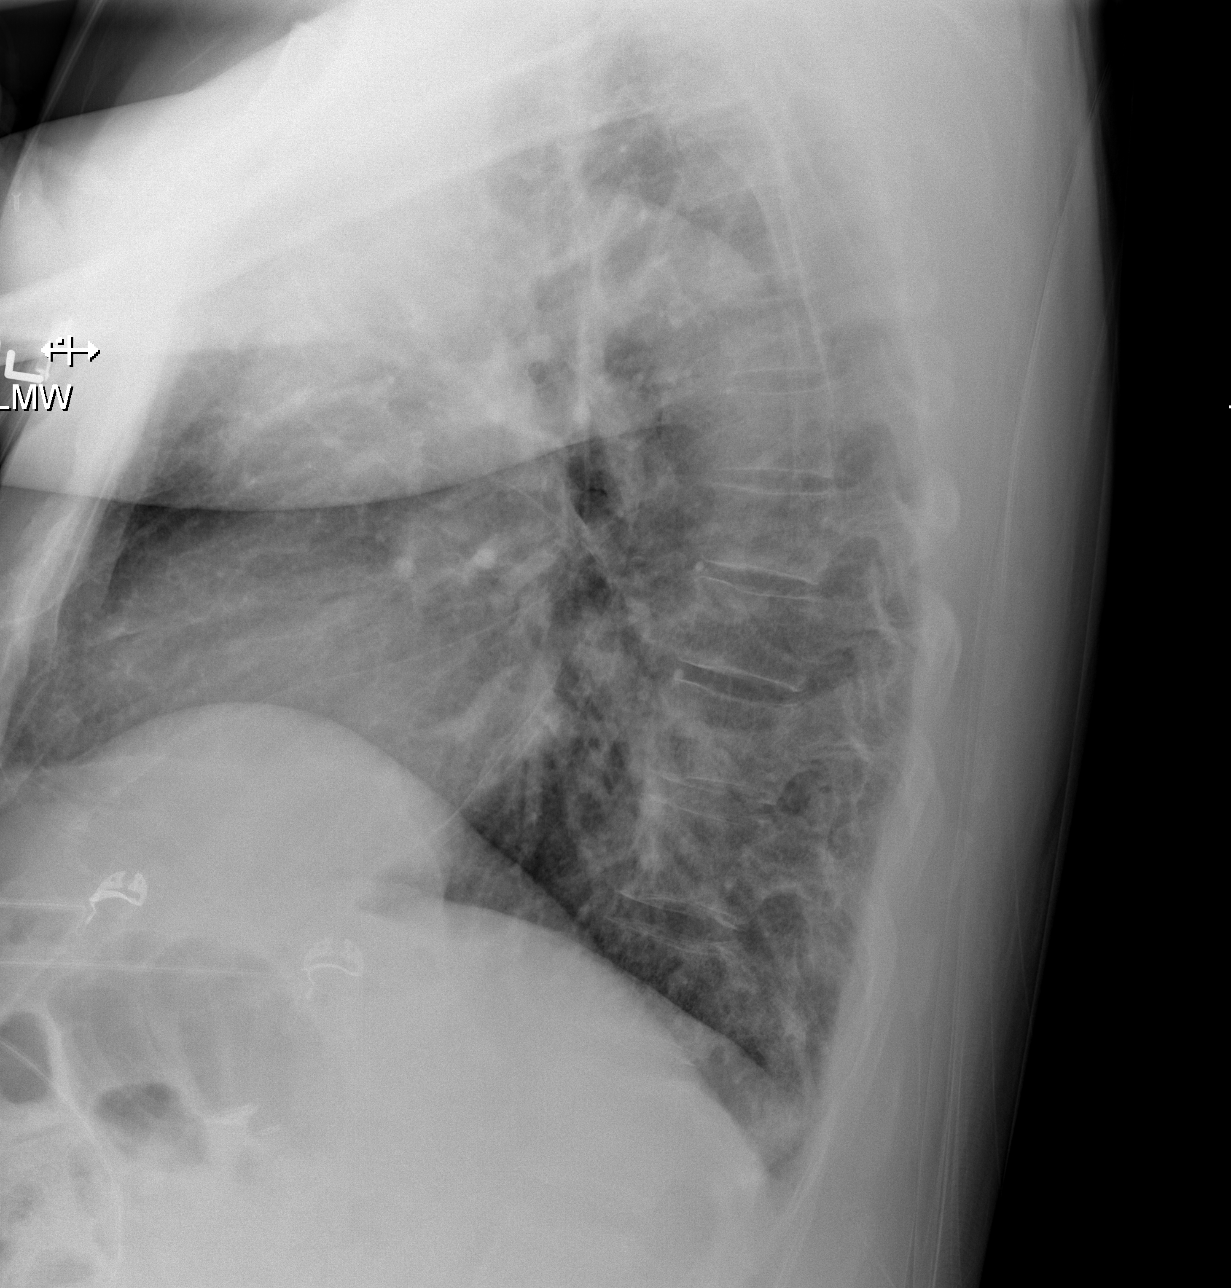
[im 3/3]
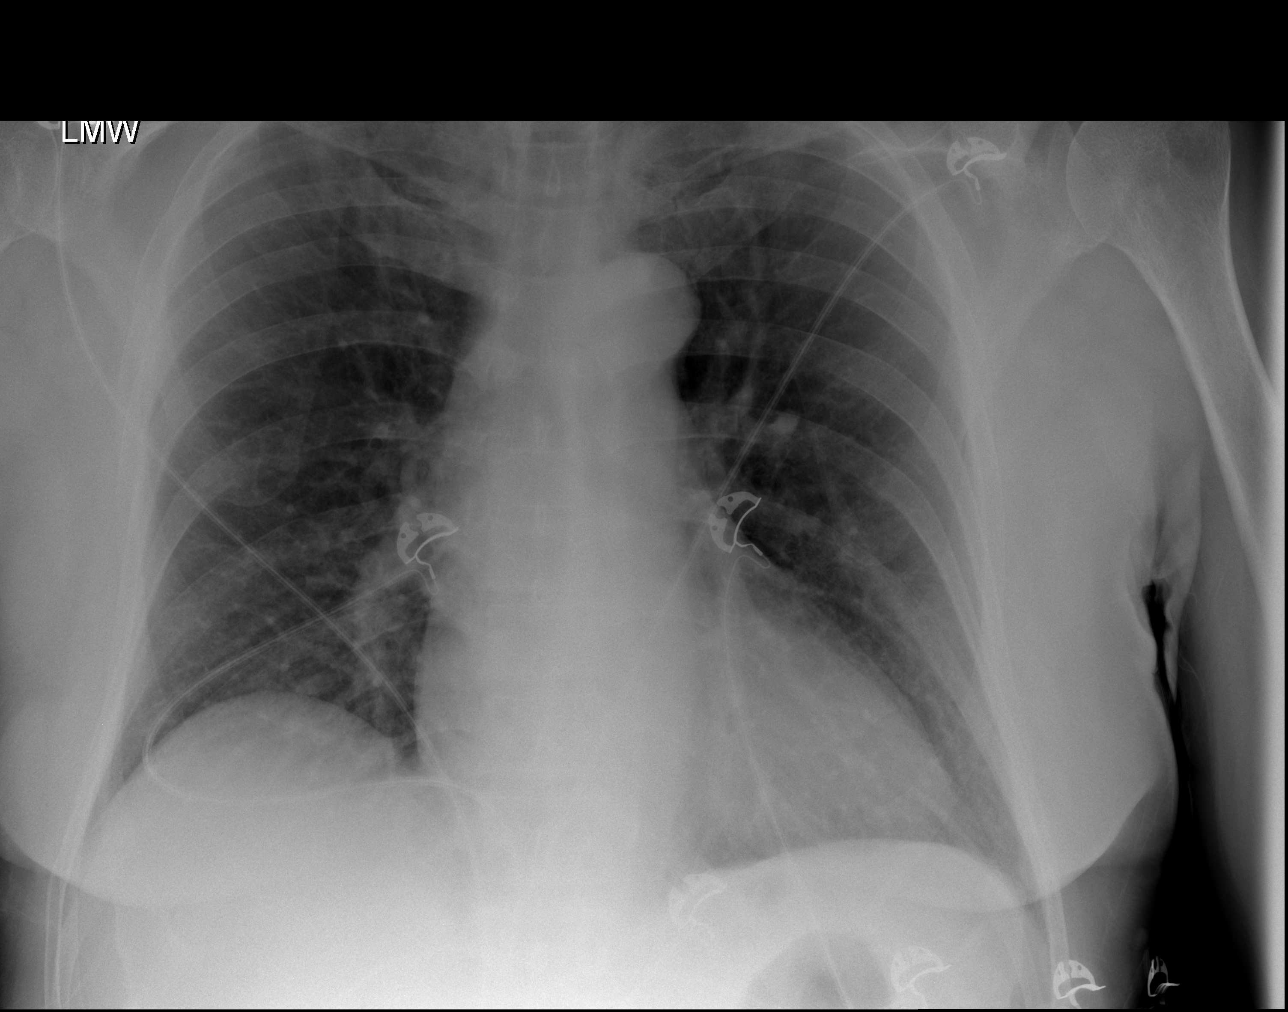

[3 of 3 positions shown; findings below may reference images not displayed]

FINDINGS: The heart size and mediastinal contours are within normal limits.
Both lungs are clear. The visualized skeletal structures are
unremarkable.
IMPRESSION: No active cardiopulmonary disease.

## 2018-07-13 ENCOUNTER — Ambulatory Visit: Payer: Self-pay | Admitting: Nurse Practitioner

## 2018-11-23 ENCOUNTER — Other Ambulatory Visit: Payer: Self-pay

## 2018-12-11 ENCOUNTER — Telehealth: Payer: Self-pay

## 2018-12-11 NOTE — Telephone Encounter (Signed)
CONFIRMED 12-15-18 OV AS VIRTUAL.

## 2018-12-15 ENCOUNTER — Other Ambulatory Visit: Payer: Self-pay

## 2018-12-15 ENCOUNTER — Ambulatory Visit (INDEPENDENT_AMBULATORY_CARE_PROVIDER_SITE_OTHER): Payer: Medicare Other | Admitting: Nurse Practitioner

## 2018-12-15 ENCOUNTER — Encounter: Payer: Self-pay | Admitting: Nurse Practitioner

## 2018-12-15 VITALS — Ht 75.0 in | Wt 228.0 lb

## 2018-12-15 DIAGNOSIS — Z0001 Encounter for general adult medical examination with abnormal findings: Secondary | ICD-10-CM | POA: Insufficient documentation

## 2018-12-15 DIAGNOSIS — I10 Essential (primary) hypertension: Secondary | ICD-10-CM | POA: Diagnosis not present

## 2018-12-15 DIAGNOSIS — K439 Ventral hernia without obstruction or gangrene: Secondary | ICD-10-CM | POA: Insufficient documentation

## 2018-12-15 DIAGNOSIS — E039 Hypothyroidism, unspecified: Secondary | ICD-10-CM | POA: Diagnosis not present

## 2018-12-15 DIAGNOSIS — Z23 Encounter for immunization: Secondary | ICD-10-CM

## 2018-12-15 MED ORDER — PNEUMOCOCCAL 13-VAL CONJ VACC IM SUSP: 0.5000 mL | Freq: Once | INTRAMUSCULAR | 0 refills | Status: AC

## 2018-12-15 NOTE — Progress Notes (Signed)
[facility] [place], [place] [address]  Internal MEDICINE  Telephone Visit  Patient Name: Jeremiah Price  [id number]  [id number]  Date of Service: 12/15/2018  I connected with the patient at 3:53pm by telephone and verified the patients identity using two identifiers.   I discussed the limitations, risks, security and privacy concerns of performing an evaluation and management service by telephone and the availability of in person appointments. I also discussed with the patient that there may be a patient responsible charge related to the service.  The patient expressed understanding and agrees to proceed.    Chief Complaint  Patient presents with  . Telephone Assessment  . Telephone Screen  . Annual Exam  . Hypertension    The patient has been contacted via telephone for follow up visit due to concerns for spread of novel coronavirus. The patient presents for health maintenance exam. States that he has small hernia just below the belly button. Only notices this when he is exerting himself, and this only happens sometimes. He states that he is feeling well, overall. Needs to have routine, fasting labs. He should also have prevnar 13 vaccine.       Current Medication: Outpatient Encounter Medications as of 12/15/2018  Medication Sig Note  . acetaminophen (TYLENOL) 500 MG tablet Take 500 mg by mouth every 6 (six) hours as needed.   . bismuth subsalicylate (PEPTO BISMOL) 262 MG/15ML suspension Take 30 mLs by mouth every 6 (six) hours as needed. 03/03/2016: PRN  . bisoprolol-hydrochlorothiazide (ZIAC) 5-6.25 MG tablet Take 1 tablet by mouth daily.   . famotidine (PEPCID AC) 10 MG chewable tablet Chew 10 mg by mouth 2 (two) times daily. 03/03/2016: PRN  . levothyroxine (SYNTHROID, LEVOTHROID) 50 MCG tablet Take 1 tablet (50 mcg total) by mouth daily before breakfast.   . pneumococcal 13-valent conjugate vaccine (PREVNAR 13) SUSP injection Inject 0.5 mLs into the  muscle once for 1 dose.    No facility-administered encounter medications on file as of 12/15/2018.     Surgical History: Past Surgical History:  Procedure Laterality Date  . CHOLECYSTECTOMY      Medical History: Past Medical History:  Diagnosis Date  . Hypertension   . Hypothyroidism   . Vertigo     Family History: Family History  Problem Relation Age of Onset  . CAD Neg Hx     Social History   Socioeconomic History  . Marital status: Married    Spouse name: Not on file  . Number of children: Not on file  . Years of education: Not on file  . Highest education level: Not on file  Occupational History  . Not on file  Social Needs  . Financial resource strain: Not on file  . Food insecurity    Worry: Not on file    Inability: Not on file  . Transportation needs    Medical: Not on file    Non-medical: Not on file  Tobacco Use  . Smoking status: Former Research scientist (life sciences)  . Smokeless tobacco: Never Used  . Tobacco comment: quit >30 years ago  Substance and Sexual Activity  . Alcohol use: Yes    Comment: occasionally  . Drug use: No    Comment: Quit >40 years ago  . Sexual activity: Not on file  Lifestyle  . Physical activity    Days per week: Not on file    Minutes per session: Not on file  . Stress: Not on file  Relationships  . Social  connections    Talks on phone: Not on file    Gets together: Not on file    Attends religious service: Not on file    Active member of club or organization: Not on file    Attends meetings of clubs or organizations: Not on file    Relationship status: Not on file  . Intimate partner violence    Fear of current or ex partner: Not on file    Emotionally abused: Not on file    Physically abused: Not on file    Forced sexual activity: Not on file  Other Topics Concern  . Not on file  Social History Narrative   Active and independent at baseline      Review of Systems  Constitutional: Negative for activity change, chills,  fatigue and unexpected weight change.  HENT: Negative for congestion, rhinorrhea, sneezing and sore throat.   Respiratory: Negative for cough, chest tightness and shortness of breath.   Cardiovascular: Negative for chest pain and palpitations.  Gastrointestinal: Negative for abdominal pain, constipation, diarrhea, nausea and vomiting.       Small ventral hernia present just below the naval.   Endocrine: Negative for cold intolerance, heat intolerance, polydipsia and polyuria.       Patient due to have check of thyroid panel  Musculoskeletal: Negative for arthralgias, back pain, joint swelling and neck pain.  Skin: Negative for rash.  Allergic/Immunologic: Negative for environmental allergies.  Neurological: Negative for dizziness, tremors, numbness and headaches.  Hematological: Negative for adenopathy. Does not bruise/bleed easily.  Psychiatric/Behavioral: Negative for behavioral problems (Depression), sleep disturbance and suicidal ideas. The patient is not nervous/anxious.     Today's Vitals   12/15/18 1520  Weight: 228 lb (103.4 kg)  Height: 6\' 3"  (1.905 m)   Body mass index is 28.5 kg/m.  Observation/Objective:  The patient is alert and oriented. He is pleasant and answering all questions appropriately. Breathing is non-labored. He is in no acute distress.   Depression screen [facility] 2/9 12/15/2018 12/15/2018 01/12/2018  Decreased Interest 0 0 0  Down, Depressed, Hopeless 0 0 0  PHQ - 2 Score 0 0 0    Functional Status Survey: Is the patient deaf or have difficulty hearing?: No Does the patient have difficulty seeing, even when wearing glasses/contacts?: No Does the patient have difficulty concentrating, remembering, or making decisions?: No Does the patient have difficulty walking or climbing stairs?: No Does the patient have difficulty dressing or bathing?: No Does the patient have difficulty doing errands alone such as visiting a doctor's office or shopping?: No  MMSE - [place] Exam 12/15/2018  Orientation to time 5  Orientation to Place 5  Registration 3  Attention/ Calculation 5  Recall 3  Language- name 2 objects 2  Language- repeat 1  Language- follow 3 step command 3  Language- read & follow direction 1  Write a sentence 1  Copy design 1  Total score 30    Fall Risk  12/15/2018 12/15/2018 01/12/2018  Falls in the past year? 0 1 0  Number falls in past yr: - 0 -  Injury with Fall? - 0 -     Assessment/Plan: 1. Encounter for general adult medical examination with abnormal findings Annual health maintenance exam today.   2. Essential hypertension Stable. Continue bp medication as prescribed   3. Acquired hypothyroidism Due to have thyroid panel checked. Adjust levothyroxine as indicated   4. Ventral hernia without obstruction or gangrene Small and not bothersome  to patient. Will monitor closely.   5. Need for vaccination against Streptococcus pneumoniae using pneumococcal conjugate vaccine 13 Prescription for prevnar 13 sent to his pharmacy for administration.  - pneumococcal 13-valent conjugate vaccine (PREVNAR 13) SUSP injection; Inject 0.5 mLs into the muscle once for 1 dose.  Dispense: 0.5 mL; Refill: 0  General Counseling: Hilman verbalizes understanding of the findings of today's phone visit and agrees with plan of treatment. I have discussed any further diagnostic evaluation that may be needed or ordered today. We also reviewed his medications today. he has been encouraged to call the office with any questions or concerns that should arise related to todays visit.  This patient was seen by [place] with Dr Lavera Guise as a part of collaborative care agreement  Meds ordered this encounter  Medications  . pneumococcal 13-valent conjugate vaccine (PREVNAR 13) SUSP injection    Sig: Inject 0.5 mLs into the muscle once for 1 dose.    Dispense:  0.5 mL    Refill:  0    Order Specific Question:    Supervising Provider    Answer:   Lavera Guise [id number]    Time spent: 9 Minutes    Dr Lavera Guise Internal medicine

## 2019-05-10 ENCOUNTER — Other Ambulatory Visit: Payer: Self-pay

## 2019-05-10 DIAGNOSIS — I10 Essential (primary) hypertension: Secondary | ICD-10-CM

## 2019-05-10 MED ORDER — BISOPROLOL-HYDROCHLOROTHIAZIDE 5-6.25 MG PO TABS: 1.0000 | ORAL_TABLET | Freq: Every day | ORAL | 0 refills | Status: DC

## 2019-05-17 ENCOUNTER — Other Ambulatory Visit: Payer: Self-pay

## 2019-05-17 DIAGNOSIS — E039 Hypothyroidism, unspecified: Secondary | ICD-10-CM

## 2019-05-17 MED ORDER — LEVOTHYROXINE SODIUM 50 MCG PO TABS: 50.0000 ug | ORAL_TABLET | Freq: Every day | ORAL | 0 refills | Status: DC

## 2019-06-17 ENCOUNTER — Ambulatory Visit: Payer: Medicare Other | Admitting: Nurse Practitioner

## 2019-09-07 ENCOUNTER — Other Ambulatory Visit: Payer: Self-pay

## 2019-09-07 DIAGNOSIS — I10 Essential (primary) hypertension: Secondary | ICD-10-CM

## 2019-09-07 MED ORDER — BISOPROLOL-HYDROCHLOROTHIAZIDE 5-6.25 MG PO TABS: 1.0000 | ORAL_TABLET | Freq: Every day | ORAL | 1 refills | Status: DC

## 2019-09-08 ENCOUNTER — Other Ambulatory Visit: Payer: Self-pay

## 2019-09-08 DIAGNOSIS — E039 Hypothyroidism, unspecified: Secondary | ICD-10-CM

## 2019-09-08 MED ORDER — LEVOTHYROXINE SODIUM 50 MCG PO TABS: 50.0000 ug | ORAL_TABLET | Freq: Every day | ORAL | 0 refills | Status: DC

## 2019-12-17 ENCOUNTER — Ambulatory Visit (INDEPENDENT_AMBULATORY_CARE_PROVIDER_SITE_OTHER): Payer: Medicare Other | Admitting: Nurse Practitioner

## 2019-12-17 ENCOUNTER — Encounter: Payer: Self-pay | Admitting: Nurse Practitioner

## 2019-12-17 VITALS — BP 162/90 | HR 65 | Temp 97.0°F | Resp 16 | Ht 75.5 in | Wt 225.4 lb

## 2019-12-17 DIAGNOSIS — E039 Hypothyroidism, unspecified: Secondary | ICD-10-CM

## 2019-12-17 DIAGNOSIS — R300 Dysuria: Secondary | ICD-10-CM

## 2019-12-17 DIAGNOSIS — I10 Essential (primary) hypertension: Secondary | ICD-10-CM

## 2019-12-17 DIAGNOSIS — Z23 Encounter for immunization: Secondary | ICD-10-CM | POA: Diagnosis not present

## 2019-12-17 DIAGNOSIS — Z0001 Encounter for general adult medical examination with abnormal findings: Secondary | ICD-10-CM

## 2019-12-17 MED ORDER — LEVOTHYROXINE SODIUM 50 MCG PO TABS: 50.0000 ug | ORAL_TABLET | Freq: Every day | ORAL | 0 refills | Status: DC

## 2019-12-17 MED ORDER — PNEUMOVAX 23 25 MCG/0.5ML IJ INJ: INJECTION | INTRAMUSCULAR | 0 refills | Status: DC

## 2019-12-17 MED ORDER — BISOPROLOL-HYDROCHLOROTHIAZIDE 5-6.25 MG PO TABS: ORAL_TABLET | ORAL | 1 refills | Status: DC

## 2019-12-17 NOTE — Progress Notes (Addendum)
[facility] [place], [place] [address]  Internal MEDICINE  Office Visit Note  Patient Name: Jeremiah Price  [id number]  [id number]  Date of Service: 12/26/2019   Pt is here for routine health maintenance examination  Chief Complaint  Patient presents with  . Medicare Wellness  . Hypertension     The patient is here for health maintenance exam. His blood pressure is mildly elevated today. He is taking his medication as prescribed. consumes a low sodium diet and drinks plenty of water. He is overdue to have routine, fasting labs. He should also have pneumovax vaccine. He denies chest pain, chest pressure, or headache. He denies shortness of breath.     Current Medication: Outpatient Encounter Medications as of 12/17/2019  Medication Sig Note  . acetaminophen (TYLENOL) 500 MG tablet Take 500 mg by mouth every 6 (six) hours as needed.   . bismuth subsalicylate (PEPTO BISMOL) 262 MG/15ML suspension Take 30 mLs by mouth every 6 (six) hours as needed. 03/03/2016: PRN  . famotidine (PEPCID AC) 10 MG chewable tablet Chew 10 mg by mouth 2 (two) times daily. 03/03/2016: PRN  . [DISCONTINUED] bisoprolol-hydrochlorothiazide (ZIAC) 5-6.25 MG tablet Take 1 tablet by mouth daily.   . [DISCONTINUED] levothyroxine (SYNTHROID) 50 MCG tablet Take 1 tablet (50 mcg total) by mouth daily before breakfast.   . bisoprolol-hydrochlorothiazide (ZIAC) 5-6.25 MG tablet Take 1.5 tablets po QD   . levothyroxine (SYNTHROID) 50 MCG tablet Take 1 tablet (50 mcg total) by mouth daily before breakfast.   . pneumococcal 23 valent vaccine (PNEUMOVAX 23) 25 MCG/0.5ML injection Inject 0.82ml IM once    No facility-administered encounter medications on file as of 12/17/2019.    Surgical History: Past Surgical History:  Procedure Laterality Date  . CHOLECYSTECTOMY      Medical History: Past Medical History:  Diagnosis Date  . Hypertension   . Hypothyroidism   . Vertigo     Family  History: Family History  Problem Relation Age of Onset  . CAD Neg Hx       Review of Systems  Constitutional: Negative for chills, fatigue and unexpected weight change.  HENT: Negative for congestion, rhinorrhea, sneezing and sore throat.   Respiratory: Negative for cough, chest tightness and shortness of breath.   Cardiovascular: Negative for chest pain and palpitations.       Elevated blood pressure today.   Gastrointestinal: Negative for abdominal pain, constipation, diarrhea, nausea and vomiting.       Small ventral hernia present just below the naval. Unchanged since his most recent visit.   Endocrine: Negative for cold intolerance, heat intolerance, polydipsia and polyuria.       Patient due to have check of thyroid panel  Genitourinary: Negative for dysuria, frequency and urgency.  Musculoskeletal: Negative for arthralgias, back pain, joint swelling and neck pain.  Skin: Negative for rash.  Allergic/Immunologic: Negative for environmental allergies.  Neurological: Negative for dizziness, tremors, numbness and headaches.  Hematological: Negative for adenopathy. Does not bruise/bleed easily.  Psychiatric/Behavioral: Negative for behavioral problems (Depression), sleep disturbance and suicidal ideas. The patient is not nervous/anxious.      Today's Vitals   12/17/19 1532  BP: (!) 162/90  Pulse: 65  Resp: 16  Temp: (!) 97 F (36.1 C)  SpO2: 99%  Weight: 225 lb 6.4 oz (102.2 kg)  Height: 6' 3.5" (1.918 m)   Body mass index is 27.8 kg/m.  Physical Exam Vitals and nursing note reviewed.  Constitutional:  General: He is not in acute distress.    Appearance: Normal appearance. He is well-developed. He is not diaphoretic.  HENT:     Head: Normocephalic and atraumatic.     Mouth/Throat:     Pharynx: No oropharyngeal exudate.  Eyes:     Conjunctiva/sclera: Conjunctivae normal.     Pupils: Pupils are equal, round, and reactive to light.  Neck:     Thyroid: No  thyromegaly.     Vascular: No carotid bruit or JVD.     Trachea: No tracheal deviation.  Cardiovascular:     Rate and Rhythm: Normal rate and regular rhythm.     Pulses: Normal pulses.     Heart sounds: Normal heart sounds. No murmur heard. No friction rub. No gallop.   Pulmonary:     Effort: Pulmonary effort is normal. No respiratory distress.     Breath sounds: Normal breath sounds. [name] or rales.  Chest:     Chest wall: No tenderness.  Abdominal:     General: Bowel sounds are normal.     Palpations: Abdomen is soft.     Tenderness: There is no abdominal tenderness.  Musculoskeletal:        General: Normal range of motion.     Cervical back: Normal range of motion and neck supple.  Lymphadenopathy:     Cervical: No cervical adenopathy.  Skin:    General: Skin is warm and dry.     Capillary Refill: Capillary refill takes less than 2 seconds.  Neurological:     General: No focal deficit present.     Mental Status: He is alert and oriented to person, place, and time.     Cranial Nerves: No cranial nerve deficit.  Psychiatric:        Mood and Affect: Mood normal.        Behavior: Behavior normal.        Thought Content: Thought content normal.        Judgment: Judgment normal.    Depression screen [facility] 2/9 12/17/2019 12/15/2018 12/15/2018 01/12/2018  Decreased Interest 0 0 0 0  Down, Depressed, Hopeless 0 0 0 0  PHQ - 2 Score 0 0 0 0    Functional Status Survey: Is the patient deaf or have difficulty hearing?: Yes Does the patient have difficulty seeing, even when wearing glasses/contacts?: No Does the patient have difficulty concentrating, remembering, or making decisions?: Yes (remembering) Does the patient have difficulty walking or climbing stairs?: No Does the patient have difficulty dressing or bathing?: No Does the patient have difficulty doing errands alone such as visiting a doctor's office or shopping?: No  MMSE - [place] Exam 12/17/2019 12/15/2018   Orientation to time 5 5  Orientation to Place 5 5  Registration 3 3  Attention/ Calculation 5 5  Recall 3 3  Language- name 2 objects 2 2  Language- repeat 1 1  Language- follow 3 step command 3 3  Language- read & follow direction 1 1  Write a sentence 1 1  Copy design 1 1  Total score 30 30    Fall Risk  12/17/2019 12/15/2018 12/15/2018 01/12/2018  Falls in the past year? 0 0 1 0  Number falls in past yr: - - 0 -  Injury with Fall? - - 0 -  Risk for fall due to : No Fall Risks - - -  Follow up Falls evaluation completed - - -      LABS: Recent Results (from the  past 2160 hour(s))  UA/M w/rflx Culture, Routine     Status: Abnormal   Collection Time: 12/17/19  3:52 PM   Specimen: Urine   Urine  Result Value Ref Range   Specific Gravity, UA      >=1.030 (A) 1.005 - 1.030   pH, UA 5.5 5.0 - 7.5   Color, UA Yellow Yellow   Appearance Ur Clear Clear   Leukocytes,UA Negative Negative   Protein,UA 1+ (A) Negative/Trace   Glucose, UA Negative Negative   Ketones, UA Trace (A) Negative   RBC, UA Negative Negative   Bilirubin, UA Negative Negative   Urobilinogen, Ur 1.0 0.2 - 1.0 mg/dL   Nitrite, UA Negative Negative   Microscopic Examination See below:     Comment: Microscopic was indicated and was performed.   Urinalysis Reflex Comment     Comment: This specimen will not reflex to a Urine Culture.  Microscopic Examination     Status: None   Collection Time: 12/17/19  3:52 PM   Urine  Result Value Ref Range   WBC, UA None seen 0 - 5 /hpf   RBC None seen 0 - 2 /hpf   Epithelial Cells (non renal) None seen 0 - 10 /hpf   Casts None seen None seen /lpf   Bacteria, UA None seen None seen/Few   Assessment/Plan: 1. Encounter for general adult medical examination with abnormal findings Annual health maintenance exam today. Order slip given to check routine, fasting labs.   2. Essential hypertension Increase Ziac 5/6.25mg  tablets to 1.5 tablets daily. Advised him to keep  blood pressure log and bring to next visit. Limit salt and increase water intake in daily diet.  - bisoprolol-hydrochlorothiazide (ZIAC) 5-6.25 MG tablet; Take 1.5 tablets po QD  Dispense: 135 tablet; Refill: 1  3. Acquired hypothyroidism Check thyroid panel and adjust levothyroxine as indicated.  - levothyroxine (SYNTHROID) 50 MCG tablet; Take 1 tablet (50 mcg total) by mouth daily before breakfast.  Dispense: 90 tablet; Refill: 0  4. Need for vaccination against Streptococcus pneumoniae using pneumococcal conjugate vaccine 13 Prescription for pneumovax vaccine sent to his pharmacy for administration.  - pneumococcal 23 valent vaccine (PNEUMOVAX 23) 25 MCG/0.5ML injection; Inject 0.66ml IM once  Dispense: 0.5 mL; Refill: 0  5. Dysuria - UA/M w/rflx Culture, Routine  General Counseling: Saylor verbalizes understanding of the findings of todays visit and agrees with plan of treatment. I have discussed any further diagnostic evaluation that may be needed or ordered today. We also reviewed his medications today. he has been encouraged to call the office with any questions or concerns that should arise related to todays visit.    Counseling:  Hypertension Counseling:   The following hypertensive lifestyle modification were recommended and discussed:  1. Limiting alcohol intake to less than 1 oz/day of ethanol:(24 oz of beer or 8 oz of wine or 2 oz of 100-proof whiskey). 2. Take baby ASA 81 mg daily. 3. Importance of regular aerobic exercise and losing weight. 4. Reduce dietary saturated fat and cholesterol intake for overall cardiovascular health. 5. Maintaining adequate dietary potassium, calcium, and magnesium intake. 6. Regular monitoring of the blood pressure. 7. Reduce sodium intake to less than 100 mmol/day (less than 2.3 gm of sodium or less than 6 gm of sodium choride)   This patient was seen by [place] with Dr Lavera Guise as a part of collaborative care  agreement  Orders Placed This Encounter  Procedures  . Microscopic Examination  .  UA/M w/rflx Culture, Routine    Meds ordered this encounter  Medications  . pneumococcal 23 valent vaccine (PNEUMOVAX 23) 25 MCG/0.5ML injection    Sig: Inject 0.72ml IM once    Dispense:  0.5 mL    Refill:  0    Order Specific Question:   Supervising Provider    Answer:   Lavera Guise [[id number]]  . bisoprolol-hydrochlorothiazide (ZIAC) 5-6.25 MG tablet    Sig: Take 1.5 tablets po QD    Dispense:  135 tablet    Refill:  1    Please note increased dose    Order Specific Question:   Supervising Provider    Answer:   Lavera Guise [place]  . levothyroxine (SYNTHROID) 50 MCG tablet    Sig: Take 1 tablet (50 mcg total) by mouth daily before breakfast.    Dispense:  90 tablet    Refill:  0    Order Specific Question:   Supervising Provider    Answer:   Lavera Guise [[id number]]    Total time spent: 47 Minutes  Time spent includes review of chart, medications, test results, and follow up plan with the patient.     Lavera Guise, MD  Internal Medicine

## 2019-12-17 NOTE — Addendum Note (Signed)
Addended by: Leretha Pol on: 12/17/2019 04:28 PM   Modules accepted: Orders

## 2019-12-18 LAB — MICROSCOPIC EXAMINATION
Bacteria, UA: NONE SEEN
Casts: NONE SEEN /lpf
Epithelial Cells (non renal): NONE SEEN /hpf (ref 0–10)
RBC: NONE SEEN /hpf (ref 0–2)
WBC, UA: NONE SEEN /hpf (ref 0–5)

## 2019-12-18 LAB — UA/M W/RFLX CULTURE, ROUTINE
Bilirubin, UA: NEGATIVE
Glucose, UA: NEGATIVE
Leukocytes,UA: NEGATIVE
Nitrite, UA: NEGATIVE
RBC, UA: NEGATIVE
Specific Gravity, UA: >=1.03 — AB (ref 1.005–1.030)
Urobilinogen, Ur: 1 mg/dL (ref 0.2–1.0)
pH, UA: 5.5 (ref 5.0–7.5)

## 2019-12-20 ENCOUNTER — Other Ambulatory Visit: Payer: Self-pay | Admitting: Nurse Practitioner

## 2019-12-20 DIAGNOSIS — Z0001 Encounter for general adult medical examination with abnormal findings: Secondary | ICD-10-CM | POA: Diagnosis not present

## 2019-12-20 DIAGNOSIS — I10 Essential (primary) hypertension: Secondary | ICD-10-CM | POA: Diagnosis not present

## 2019-12-21 LAB — CBC
Hematocrit: 42.7 % (ref 37.5–51.0)
Hemoglobin: 14.1 g/dL (ref 13.0–17.7)
MCH: 28 pg (ref 26.6–33.0)
MCHC: 33 g/dL (ref 31.5–35.7)
MCV: 85 fL (ref 79–97)
Platelets: 293 10*3/uL (ref 150–450)
RBC: 5.04 x10E6/uL (ref 4.14–5.80)
RDW: 13.5 % (ref 11.6–15.4)
WBC: 4.2 10*3/uL (ref 3.4–10.8)

## 2019-12-21 LAB — COMPREHENSIVE METABOLIC PANEL
ALT: 8 IU/L (ref 0–44)
AST: 15 IU/L (ref 0–40)
Albumin/Globulin Ratio: 1.2 (ref 1.2–2.2)
Albumin: 4.1 g/dL (ref 3.7–4.7)
Alkaline Phosphatase: 73 IU/L (ref 44–121)
BUN/Creatinine Ratio: 14 (ref 10–24)
BUN: 13 mg/dL (ref 8–27)
Bilirubin Total: 0.3 mg/dL (ref 0.0–1.2)
CO2: 25 mmol/L (ref 20–29)
Calcium: 9.4 mg/dL (ref 8.6–10.2)
Chloride: 101 mmol/L (ref 96–106)
Creatinine, Ser: 0.91 mg/dL (ref 0.76–1.27)
GFR calc Af Amer: 97 mL/min/{1.73_m2} (ref >59)
GFR calc non Af Amer: 84 mL/min/{1.73_m2} (ref >59)
Globulin, Total: 3.4 g/dL (ref 1.5–4.5)
Glucose: 93 mg/dL (ref 65–99)
Potassium: 4.3 mmol/L (ref 3.5–5.2)
Sodium: 138 mmol/L (ref 134–144)
Total Protein: 7.5 g/dL (ref 6.0–8.5)

## 2019-12-21 LAB — LIPID PANEL WITH LDL/HDL RATIO
Cholesterol, Total: 146 mg/dL (ref 100–199)
HDL: 54 mg/dL (ref >39)
LDL Chol Calc (NIH): 70 mg/dL (ref 0–99)
LDL/HDL Ratio: 1.3 ratio (ref 0.0–3.6)
Triglycerides: 125 mg/dL (ref 0–149)
VLDL Cholesterol Cal: 22 mg/dL (ref 5–40)

## 2019-12-21 LAB — PSA: Prostate Specific Ag, Serum: 3 ng/mL (ref 0.0–4.0)

## 2019-12-21 LAB — TSH: TSH: 1.49 u[IU]/mL (ref 0.450–4.500)

## 2019-12-21 LAB — T4, FREE: Free T4: 1.29 ng/dL (ref 0.82–1.77)

## 2019-12-26 DIAGNOSIS — R300 Dysuria: Secondary | ICD-10-CM | POA: Insufficient documentation

## 2019-12-26 NOTE — Addendum Note (Signed)
Addended by: Leretha Pol on: 12/26/2019 06:49 PM   Modules accepted: Level of Service

## 2020-01-05 NOTE — Progress Notes (Signed)
Labs look good.

## 2020-01-17 ENCOUNTER — Other Ambulatory Visit: Payer: Self-pay

## 2020-01-17 ENCOUNTER — Encounter: Payer: Self-pay | Admitting: Nurse Practitioner

## 2020-01-17 ENCOUNTER — Ambulatory Visit (INDEPENDENT_AMBULATORY_CARE_PROVIDER_SITE_OTHER): Payer: Medicare Other | Admitting: Nurse Practitioner

## 2020-01-17 VITALS — BP 152/94 | HR 65 | Temp 97.8°F | Resp 16 | Ht 75.5 in | Wt 225.6 lb

## 2020-01-17 DIAGNOSIS — E039 Hypothyroidism, unspecified: Secondary | ICD-10-CM

## 2020-01-17 DIAGNOSIS — I10 Essential (primary) hypertension: Secondary | ICD-10-CM | POA: Diagnosis not present

## 2020-01-17 MED ORDER — BISOPROLOL-HYDROCHLOROTHIAZIDE 10-6.25 MG PO TABS: 1.0000 | ORAL_TABLET | Freq: Every day | ORAL | 1 refills | Status: DC

## 2020-01-17 NOTE — Progress Notes (Signed)
[facility] [place], [place] [address]  Internal MEDICINE  Office Visit Note  Patient Name: Jeremiah Price  [id number]  [id number]  Date of Service: 01/29/2020  Chief Complaint  Patient presents with  . Follow-up  . Hypertension    The patient is here for follow up visit.  -increased dose bisoprolol 5/6.25mg  tablets to 1.5 tablets daily. Blood pressure mildly improved, though still elevated. Heart rate stable. No negative side effects from increase in medication. -has had routine, fasting labs since his last visit. All were normal and reviewed with the patient today.        Current Medication: Outpatient Encounter Medications as of 01/17/2020  Medication Sig Note  . acetaminophen (TYLENOL) 500 MG tablet Take 500 mg by mouth every 6 (six) hours as needed.   . bismuth subsalicylate (PEPTO BISMOL) 262 MG/15ML suspension Take 30 mLs by mouth every 6 (six) hours as needed. 03/03/2016: PRN  . bisoprolol-hydrochlorothiazide (ZIAC) 10-6.25 MG tablet Take 1 tablet by mouth daily.   . famotidine (PEPCID AC) 10 MG chewable tablet Chew 10 mg by mouth 2 (two) times daily. 03/03/2016: PRN  . levothyroxine (SYNTHROID) 50 MCG tablet Take 1 tablet (50 mcg total) by mouth daily before breakfast.   . pneumococcal 23 valent vaccine (PNEUMOVAX 23) 25 MCG/0.5ML injection Inject 0.7ml IM once   . [DISCONTINUED] bisoprolol-hydrochlorothiazide (ZIAC) 5-6.25 MG tablet Take 1.5 tablets po QD    No facility-administered encounter medications on file as of 01/17/2020.    Surgical History: Past Surgical History:  Procedure Laterality Date  . CHOLECYSTECTOMY      Medical History: Past Medical History:  Diagnosis Date  . Hypertension   . Hypothyroidism   . Vertigo     Family History: Family History  Problem Relation Age of Onset  . CAD Neg Hx     Social History   Socioeconomic History  . Marital status: Married    Spouse name: Not on file  . Number of children:  Not on file  . Years of education: Not on file  . Highest education level: Not on file  Occupational History  . Not on file  Tobacco Use  . Smoking status: Former Research scientist (life sciences)  . Smokeless tobacco: Never Used  . Tobacco comment: quit >30 years ago  Substance and Sexual Activity  . Alcohol use: Yes    Comment: occasionally  . Drug use: No    Comment: Quit >40 years ago  . Sexual activity: Not on file  Other Topics Concern  . Not on file  Social History Narrative   Active and independent at baseline   Social Determinants of Health   Financial Resource Strain: Not on file  Food Insecurity: Not on file  Transportation Needs: Not on file  Physical Activity: Not on file  Stress: Not on file  Social Connections: Not on file  Intimate Partner Violence: Not on file      Review of Systems  Constitutional: Negative for chills, fatigue and unexpected weight change.  HENT: Negative for congestion, rhinorrhea, sneezing and sore throat.   Respiratory: Negative for cough, chest tightness and shortness of breath.   Cardiovascular: Negative for chest pain and palpitations.       Blood pressure is slightly improved from most recent visit.   Gastrointestinal: Negative for abdominal pain, constipation, diarrhea, nausea and vomiting.  Endocrine: Negative for cold intolerance, heat intolerance, polydipsia and polyuria.  Musculoskeletal: Negative for arthralgias, back pain, joint swelling and neck pain.  Skin: Negative for  rash.  Allergic/Immunologic: Negative for environmental allergies.  Neurological: Negative for dizziness, tremors, numbness and headaches.  Hematological: Negative for adenopathy. Does not bruise/bleed easily.  Psychiatric/Behavioral: Negative for behavioral problems (Depression), sleep disturbance and suicidal ideas. The patient is not nervous/anxious.     Today's Vitals   01/17/20 1511  BP: (!) 152/94  Pulse: 65  Resp: 16  Temp: 97.8 F (36.6 C)  SpO2: 99%  Weight: 225  lb 9.6 oz (102.3 kg)  Height: 6' 3.5" (1.918 m)   Body mass index is 27.83 kg/m.  Physical Exam Vitals and nursing note reviewed.  Constitutional:      General: He is not in acute distress.    Appearance: Normal appearance. He is well-developed. He is not diaphoretic.  HENT:     Head: Normocephalic and atraumatic.     Mouth/Throat:     Pharynx: No oropharyngeal exudate.  Eyes:     Conjunctiva/sclera: Conjunctivae normal.     Pupils: Pupils are equal, round, and reactive to light.  Neck:     Thyroid: No thyromegaly.     Vascular: No carotid bruit or JVD.     Trachea: No tracheal deviation.  Cardiovascular:     Rate and Rhythm: Normal rate and regular rhythm.     Heart sounds: Normal heart sounds. No murmur heard. No friction rub. No gallop.   Pulmonary:     Effort: Pulmonary effort is normal. No respiratory distress.     Breath sounds: Normal breath sounds. [name] or rales.  Chest:     Chest wall: No tenderness.  Abdominal:     Palpations: Abdomen is soft.  Musculoskeletal:        General: Normal range of motion.     Cervical back: Normal range of motion and neck supple.  Lymphadenopathy:     Cervical: No cervical adenopathy.  Skin:    General: Skin is warm and dry.     Capillary Refill: Capillary refill takes less than 2 seconds.  Neurological:     General: No focal deficit present.     Mental Status: He is alert and oriented to person, place, and time.     Cranial Nerves: No cranial nerve deficit.  Psychiatric:        Mood and Affect: Mood normal.        Behavior: Behavior normal.        Thought Content: Thought content normal.        Judgment: Judgment normal.    Assessment/Plan:  1. Essential hypertension Increase bisoprolol/HCTZ 10/6.25mg  daily. Limit salt and increase water in the diet. Monitor blood pressure closely.  - bisoprolol-hydrochlorothiazide (ZIAC) 10-6.25 MG tablet; Take 1 tablet by mouth daily.  Dispense: 90 tablet; Refill: 1  2.  Acquired hypothyroidism Stable. Continue levothyroxine as prescribed   General Counseling: Casimiro Needle understanding of the findings of todays visit and agrees with plan of treatment. I have discussed any further diagnostic evaluation that may be needed or ordered today. We also reviewed his medications today. he has been encouraged to call the office with any questions or concerns that should arise related to todays visit.   Hypertension Counseling:   The following hypertensive lifestyle modification were recommended and discussed:  1. Limiting alcohol intake to less than 1 oz/day of ethanol:(24 oz of beer or 8 oz of wine or 2 oz of 100-proof whiskey). 2. Take baby ASA 81 mg daily. 3. Importance of regular aerobic exercise and losing weight. 4. Reduce dietary saturated fat and cholesterol  intake for overall cardiovascular health. 5. Maintaining adequate dietary potassium, calcium, and magnesium intake. 6. Regular monitoring of the blood pressure. 7. Reduce sodium intake to less than 100 mmol/day (less than 2.3 gm of sodium or less than 6 gm of sodium choride)   This patient was seen by [place] with Dr Lavera Guise as a part of collaborative care agreement  Meds ordered this encounter  Medications  . bisoprolol-hydrochlorothiazide (ZIAC) 10-6.25 MG tablet    Sig: Take 1 tablet by mouth daily.    Dispense:  90 tablet    Refill:  1    Please note second change in dosing. Thanks.    Order Specific Question:   Supervising Provider    Answer:   Lavera Guise [[id number]]    Total time spent: 25 Minutes   Time spent includes review of chart, medications, test results, and follow up plan with the patient.      Dr Lavera Guise Internal medicine

## 2020-04-17 ENCOUNTER — Ambulatory Visit: Payer: Medicare Other | Admitting: Physician Assistant

## 2020-04-26 ENCOUNTER — Other Ambulatory Visit: Payer: Self-pay | Admitting: Nurse Practitioner

## 2020-04-26 DIAGNOSIS — E039 Hypothyroidism, unspecified: Secondary | ICD-10-CM

## 2020-05-05 ENCOUNTER — Other Ambulatory Visit: Payer: Self-pay | Admitting: Nurse Practitioner

## 2020-05-05 DIAGNOSIS — E039 Hypothyroidism, unspecified: Secondary | ICD-10-CM

## 2020-05-08 ENCOUNTER — Other Ambulatory Visit: Payer: Self-pay | Admitting: Nurse Practitioner

## 2020-05-08 DIAGNOSIS — E039 Hypothyroidism, unspecified: Secondary | ICD-10-CM

## 2020-05-15 ENCOUNTER — Other Ambulatory Visit: Payer: Self-pay

## 2020-05-15 DIAGNOSIS — E039 Hypothyroidism, unspecified: Secondary | ICD-10-CM

## 2020-05-15 MED ORDER — LEVOTHYROXINE SODIUM 50 MCG PO TABS: 50.0000 ug | ORAL_TABLET | Freq: Every day | ORAL | 0 refills | Status: DC

## 2020-05-18 ENCOUNTER — Encounter: Payer: Self-pay | Admitting: Physician Assistant

## 2020-05-18 ENCOUNTER — Ambulatory Visit (INDEPENDENT_AMBULATORY_CARE_PROVIDER_SITE_OTHER): Payer: Medicare Other | Admitting: Physician Assistant

## 2020-05-18 ENCOUNTER — Other Ambulatory Visit: Payer: Self-pay

## 2020-05-18 DIAGNOSIS — Z0100 Encounter for examination of eyes and vision without abnormal findings: Secondary | ICD-10-CM | POA: Diagnosis not present

## 2020-05-18 DIAGNOSIS — H9193 Unspecified hearing loss, bilateral: Secondary | ICD-10-CM | POA: Diagnosis not present

## 2020-05-18 DIAGNOSIS — I10 Essential (primary) hypertension: Secondary | ICD-10-CM

## 2020-05-18 DIAGNOSIS — E039 Hypothyroidism, unspecified: Secondary | ICD-10-CM | POA: Diagnosis not present

## 2020-05-18 NOTE — Progress Notes (Signed)
[facility] [place], [place] [address]  Internal MEDICINE  Office Visit Note  Patient Name: Jeremiah Price  [id number]  [id number]  Date of Service: 05/18/2020  Chief Complaint  Patient presents with  . Follow-up    Review results, left hand tingling,     HPI Patient is here for follow up. -BP cuff at home died and will get a replacement. Did not take BP med this morning. -Struggles with waiting to eat after synthroid but doing it every day. -[employer]/ [employer] has a nurse that came to his home a week or so ago and he brought papers from them to office. BP when they checked it was 138/80, A1c 5.8%, ABI done and left .99, right .94 -Recommended eye doctor and audiology for some hearing loss esp L side. He reports that for the most part he does ok though. -Left hand tingling this morning, this happens sometimes based on how he has slept. This has resolved though.  Current Medication: Outpatient Encounter Medications as of 05/18/2020  Medication Sig Note  . acetaminophen (TYLENOL) 500 MG tablet Take 500 mg by mouth every 6 (six) hours as needed.   . bisoprolol-hydrochlorothiazide (ZIAC) 10-6.25 MG tablet Take 1 tablet by mouth daily.   Marland Kitchen levothyroxine (SYNTHROID) 50 MCG tablet Take 1 tablet (50 mcg total) by mouth daily before breakfast.   . pneumococcal 23 valent vaccine (PNEUMOVAX 23) 25 MCG/0.5ML injection Inject 0.59ml IM once   . bismuth subsalicylate (PEPTO BISMOL) 262 MG/15ML suspension Take 30 mLs by mouth every 6 (six) hours as needed. (Patient not taking: Reported on 05/18/2020) 03/03/2016: PRN  . famotidine (PEPCID AC) 10 MG chewable tablet Chew 10 mg by mouth 2 (two) times daily. (Patient not taking: Reported on 05/18/2020) 03/03/2016: PRN   No facility-administered encounter medications on file as of 05/18/2020.    Surgical History: Past Surgical History:  Procedure Laterality Date  . CHOLECYSTECTOMY      Medical History: Past Medical History:   Diagnosis Date  . Hypertension   . Hypothyroidism   . Vertigo     Family History: Family History  Problem Relation Age of Onset  . CAD Neg Hx     Social History   Socioeconomic History  . Marital status: Married    Spouse name: Not on file  . Number of children: Not on file  . Years of education: Not on file  . Highest education level: Not on file  Occupational History  . Not on file  Tobacco Use  . Smoking status: Former Research scientist (life sciences)  . Smokeless tobacco: Never Used  . Tobacco comment: quit >30 years ago  Substance and Sexual Activity  . Alcohol use: Yes    Comment: occasionally  . Drug use: No    Comment: Quit >40 years ago  . Sexual activity: Not on file  Other Topics Concern  . Not on file  Social History Narrative   Active and independent at baseline   Social Determinants of Health   Financial Resource Strain: Not on file  Food Insecurity: Not on file  Transportation Needs: Not on file  Physical Activity: Not on file  Stress: Not on file  Social Connections: Not on file  Intimate Partner Violence: Not on file      Review of Systems  Constitutional: Negative for chills, fatigue and unexpected weight change.  HENT: Negative for congestion, postnasal drip, rhinorrhea, sneezing and sore throat.   Eyes: Negative for redness.  Respiratory: Negative for cough, chest  tightness and shortness of breath.   Cardiovascular: Negative for chest pain, palpitations and leg swelling.  Gastrointestinal: Negative for abdominal pain, constipation, diarrhea, nausea and vomiting.  Genitourinary: Negative for dysuria and frequency.  Musculoskeletal: Negative for arthralgias, back pain, joint swelling and neck pain.  Skin: Negative for rash.  Neurological: Positive for numbness. Negative for tremors.       Tingling left hand this morning resolved  Hematological: Negative for adenopathy. Does not bruise/bleed easily.  Psychiatric/Behavioral: Negative for behavioral problems  (Depression), sleep disturbance and suicidal ideas. The patient is not nervous/anxious.     Vital Signs: BP (!) 159/99   Pulse (!) 52   Temp 98.1 F (36.7 C)   Resp 16   Ht 6\' 3"  (1.905 m)   Wt 221 lb (100.2 kg)   SpO2 99%   BMI 27.62 kg/m    Physical Exam Vitals and nursing note reviewed.  Constitutional:      General: He is not in acute distress.    Appearance: He is well-developed. He is not diaphoretic.  HENT:     Head: Normocephalic and atraumatic.     Mouth/Throat:     Pharynx: No oropharyngeal exudate.  Eyes:     Pupils: Pupils are equal, round, and reactive to light.  Neck:     Thyroid: No thyromegaly.     Vascular: [name].     Trachea: No tracheal deviation.  Cardiovascular:     Rate and Rhythm: Normal rate and regular rhythm.     Heart sounds: Normal heart sounds. No murmur heard. No friction rub. No gallop.   Pulmonary:     Effort: Pulmonary effort is normal. No respiratory distress.     Breath sounds: [name] or rales.  Chest:     Chest wall: No tenderness.  Abdominal:     General: Bowel sounds are normal.     Palpations: Abdomen is soft.  Musculoskeletal:        General: Normal range of motion.     Cervical back: Normal range of motion and neck supple.     Right lower leg: [name].     Left lower leg: [name].  Lymphadenopathy:     Cervical: No cervical adenopathy.  Skin:    General: Skin is warm and dry.  Neurological:     Mental Status: He is alert and oriented to person, place, and time.     Cranial Nerves: No cranial nerve deficit.  Psychiatric:        Behavior: Behavior normal.        Thought Content: Thought content normal.        Judgment: Judgment normal.        Assessment/Plan: 1. Essential hypertension Bp elevated in office, but patient forgot to take his BP pill before coming in. Nurse visit at home showed well controlled and will continue ziac. Advised to obtain a new BP cuff to monitor at home. Encouraged to improved  diet and exercise. Patient is mowing lawn and advised to go for walks in neighborhood as well.  2. Acquired hypothyroidism Continue synthroid as prescribed  3. Bilateral hearing loss, unspecified hearing loss type - Ambulatory referral to Audiology  4. Encounter for vision screening - Ambulatory referral to Ophthalmology    General Counseling: Casimiro Needle understanding of the findings of todays visit and agrees with plan of treatment. I have discussed any further diagnostic evaluation that may be needed or ordered today. We also reviewed his medications today. he has  been encouraged to call the office with any questions or concerns that should arise related to todays visit.    Orders Placed This Encounter  Procedures  . Ambulatory referral to Ophthalmology  . Ambulatory referral to Audiology    No orders of the defined types were placed in this encounter.   This patient was seen by Drema Dallas, PA-C in collaboration with Dr. Clayborn Bigness as a part of collaborative care agreement.   Total time spent:30 Minutes Time spent includes review of chart, medications, test results, and follow up plan with the patient.      Dr Lavera Guise Internal medicine

## 2020-06-12 ENCOUNTER — Telehealth: Payer: Self-pay

## 2020-06-12 NOTE — Telephone Encounter (Signed)
Faxed Audiology referral to [facility] at [phone] and faxed Diabetic eye exam referral to [facility] at [phone].Loma Sousa

## 2020-06-15 ENCOUNTER — Telehealth: Payer: Self-pay

## 2020-06-15 NOTE — Telephone Encounter (Signed)
Patient scheduled at [facility] for diabetic eye exam on 12-28-2020 at 2:30p with Dr.Porfilio. Jeremiah Price

## 2020-06-16 ENCOUNTER — Other Ambulatory Visit: Payer: Self-pay | Admitting: Internal Medicine

## 2020-06-16 ENCOUNTER — Other Ambulatory Visit: Payer: Self-pay | Admitting: Nurse Practitioner

## 2020-06-16 DIAGNOSIS — I10 Essential (primary) hypertension: Secondary | ICD-10-CM

## 2020-06-16 DIAGNOSIS — E039 Hypothyroidism, unspecified: Secondary | ICD-10-CM

## 2020-07-17 ENCOUNTER — Telehealth: Payer: Self-pay

## 2020-07-17 NOTE — Telephone Encounter (Signed)
Patient was seen with Audiology at [facility] on 07/04/2020 at 7:30a. Jeremiah Price

## 2020-07-20 ENCOUNTER — Other Ambulatory Visit: Payer: Self-pay | Admitting: Internal Medicine

## 2020-07-20 DIAGNOSIS — E039 Hypothyroidism, unspecified: Secondary | ICD-10-CM

## 2020-08-17 ENCOUNTER — Ambulatory Visit (INDEPENDENT_AMBULATORY_CARE_PROVIDER_SITE_OTHER): Payer: Medicare Other | Admitting: Physician Assistant

## 2020-08-17 ENCOUNTER — Other Ambulatory Visit: Payer: Self-pay

## 2020-08-17 ENCOUNTER — Encounter: Payer: Self-pay | Admitting: Physician Assistant

## 2020-08-17 DIAGNOSIS — E039 Hypothyroidism, unspecified: Secondary | ICD-10-CM | POA: Diagnosis not present

## 2020-08-17 DIAGNOSIS — I714 Abdominal aortic aneurysm, without rupture, unspecified: Secondary | ICD-10-CM

## 2020-08-17 DIAGNOSIS — I700 Atherosclerosis of aorta: Secondary | ICD-10-CM | POA: Diagnosis not present

## 2020-08-17 DIAGNOSIS — I10 Essential (primary) hypertension: Secondary | ICD-10-CM | POA: Diagnosis not present

## 2020-08-17 MED ORDER — ROSUVASTATIN CALCIUM 5 MG PO TABS: 5.0000 mg | ORAL_TABLET | Freq: Every day | ORAL | 3 refills | Status: DC

## 2020-08-17 MED ORDER — AMLODIPINE BESYLATE 5 MG PO TABS: 5.0000 mg | ORAL_TABLET | Freq: Every day | ORAL | 1 refills | Status: DC

## 2020-08-17 NOTE — Progress Notes (Signed)
[facility] [place], [place] [address]  Internal MEDICINE  Office Visit Note  Patient Name: Jeremiah Price  [id number]  [id number]  Date of Service: 08/17/2020  Chief Complaint  Patient presents with   Follow-up    Difficulty standing up when on the ground    Hypertension    HPI Pt is here for routine follow up -BP at home not checked recently bc his machine is broken and he is going to go to the pharmacy to get a new one -Forgot to take BP pill this morning as well. He states he might be moving and has been distracted due to this. He will do better about taking BP meds and discussed stopping current medication and switching to norvasc due to having low resting HR even without his bisoprolol today. Denies any dizziness or light headedness -Additionally discussed that CT from 2018 showed AAA and that follow up imaging to monitor this is necessary. Will order abdominal US for further evaluation.  -Aortic atherosclerosis was also seen on this imaging and will start on low dose statin  Current Medication: Outpatient Encounter Medications as of 08/17/2020  Medication Sig Note   acetaminophen (TYLENOL) 500 MG tablet Take 500 mg by mouth every 6 (six) hours as needed.    amLODipine (NORVASC) 5 MG tablet Take 1 tablet (5 mg total) by mouth daily.    bisoprolol-hydrochlorothiazide (ZIAC) 10-6.25 MG tablet Take 1 tablet by mouth daily.    EUTHYROX 50 MCG tablet TAKE 1 TABLET BY MOUTH ONCE DAILY BEFORE BREAKFAST    rosuvastatin (CRESTOR) 5 MG tablet Take 1 tablet (5 mg total) by mouth daily.    pneumococcal 23 valent vaccine (PNEUMOVAX 23) 25 MCG/0.5ML injection Inject 0.82m IM once    [DISCONTINUED] bismuth subsalicylate (PEPTO BISMOL) 262 MG/15ML suspension Take 30 mLs by mouth every 6 (six) hours as needed. (Patient not taking: Reported on 05/18/2020) 03/03/2016: PRN   [DISCONTINUED] famotidine (PEPCID AC) 10 MG chewable tablet Chew 10 mg by mouth 2 (two) times daily.  (Patient not taking: Reported on 05/18/2020) 03/03/2016: PRN   No facility-administered encounter medications on file as of 08/17/2020.    Surgical History: Past Surgical History:  Procedure Laterality Date   CHOLECYSTECTOMY      Medical History: Past Medical History:  Diagnosis Date   Hypertension    Hypothyroidism    Vertigo     Family History: Family History  Problem Relation Age of Onset   CAD Neg Hx     Social History   Socioeconomic History   Marital status: Married    Spouse name: Not on file   Number of children: Not on file   Years of education: Not on file   Highest education level: Not on file  Occupational History   Not on file  Tobacco Use   Smoking status: Former   Smokeless tobacco: Never   Tobacco comments:    quit >30 years ago  Substance and Sexual Activity   Alcohol use: Yes    Comment: occasionally   Drug use: No    Comment: Quit >40 years ago   Sexual activity: Not on file  Other Topics Concern   Not on file  Social History Narrative   Active and independent at baseline   Social Determinants of Health   Financial Resource Strain: Not on file  Food Insecurity: Not on file  Transportation Needs: Not on file  Physical Activity: Not on file  Stress: Not on file  Social Connections:  Not on file  Intimate Partner Violence: Not on file      Review of Systems  Constitutional:  Negative for chills, fatigue and unexpected weight change.  HENT:  Negative for congestion, postnasal drip, rhinorrhea, sneezing and sore throat.   Eyes:  Negative for redness.  Respiratory:  Negative for cough, chest tightness and shortness of breath.   Cardiovascular:  Negative for chest pain and palpitations.  Gastrointestinal:  Negative for abdominal pain, constipation, diarrhea, nausea and vomiting.  Genitourinary:  Negative for dysuria and frequency.  Musculoskeletal:  Positive for arthralgias. Negative for back pain, joint swelling and neck pain.  Skin:   Negative for rash.  Neurological: Negative.  Negative for tremors and numbness.  Hematological:  Negative for adenopathy. Does not bruise/bleed easily.  Psychiatric/Behavioral:  Negative for behavioral problems (Depression), sleep disturbance and suicidal ideas. The patient is not nervous/anxious.    Vital Signs: BP (!) 153/99   Pulse (!) 57   Temp (!) 97.3 F (36.3 C)   Resp 16   Ht 6' 3.5" (1.918 m)   Wt 219 lb 3.2 oz (99.4 kg)   SpO2 99%   BMI 27.04 kg/m    Physical Exam Vitals and nursing note reviewed.  Constitutional:      General: He is not in acute distress.    Appearance: He is well-developed. He is not diaphoretic.  HENT:     Head: Normocephalic and atraumatic.     Mouth/Throat:     Pharynx: No oropharyngeal exudate.  Eyes:     Pupils: Pupils are equal, round, and reactive to light.  Neck:     Thyroid: No thyromegaly.     Vascular: [name].     Trachea: No tracheal deviation.  Cardiovascular:     Rate and Rhythm: Normal rate and regular rhythm.     Heart sounds: Normal heart sounds. No murmur heard.   No friction rub. No gallop.  Pulmonary:     Effort: Pulmonary effort is normal. No respiratory distress.     Breath sounds: [name] or rales.  Chest:     Chest wall: No tenderness.  Abdominal:     General: Bowel sounds are normal.     Palpations: Abdomen is soft.  Musculoskeletal:        General: Normal range of motion.     Cervical back: Normal range of motion and neck supple.  Lymphadenopathy:     Cervical: No cervical adenopathy.  Skin:    General: Skin is warm and dry.  Neurological:     Mental Status: He is alert and oriented to person, place, and time.     Cranial Nerves: No cranial nerve deficit.  Psychiatric:        Behavior: Behavior normal.        Thought Content: Thought content normal.        Judgment: Judgment normal.       Assessment/Plan: 1. Essential hypertension Will stop ziac due to low resting HR without his medication and  will start on norvasc. May need to titrate up. Patient will monitor BP closely - amLODipine (NORVASC) 5 MG tablet; Take 1 tablet (5 mg total) by mouth daily.  Dispense: 30 tablet; Refill: 1  2. Acquired hypothyroidism Continue synthroid  3. Aortic atherosclerosis ([place]) Will start on crestor  - rosuvastatin (CRESTOR) 5 MG tablet; Take 1 tablet (5 mg total) by mouth daily.  Dispense: 90 tablet; Refill: 3  4. AAA (abdominal aortic aneurysm) without rupture ([place]) Will re-evaluate with  [place] for monitoring - VAS [place] AAA DUPLEX; Future    General Counseling: Casimiro Needle understanding of the findings of todays visit and agrees with plan of treatment. I have discussed any further diagnostic evaluation that may be needed or ordered today. We also reviewed his medications today. he has been encouraged to call the office with any questions or concerns that should arise related to todays visit.    Orders Placed This Encounter  Procedures   VAS [place] AAA DUPLEX     Meds ordered this encounter  Medications   amLODipine (NORVASC) 5 MG tablet    Sig: Take 1 tablet (5 mg total) by mouth daily.    Dispense:  30 tablet    Refill:  1   rosuvastatin (CRESTOR) 5 MG tablet    Sig: Take 1 tablet (5 mg total) by mouth daily.    Dispense:  90 tablet    Refill:  3     This patient was seen by Drema Dallas, PA-C in collaboration with Dr. Clayborn Bigness as a part of collaborative care agreement.   Total time spent:35 Minutes Time spent includes review of chart, medications, test results, and follow up plan with the patient.      Dr Lavera Guise Internal medicine

## 2020-09-13 ENCOUNTER — Other Ambulatory Visit: Payer: Medicare Other

## 2020-09-13 DIAGNOSIS — Z03818 Encounter for observation for suspected exposure to other biological agents ruled out: Secondary | ICD-10-CM | POA: Diagnosis not present

## 2020-09-13 DIAGNOSIS — Z20822 Contact with and (suspected) exposure to covid-19: Secondary | ICD-10-CM | POA: Diagnosis not present

## 2020-09-28 ENCOUNTER — Ambulatory Visit: Payer: Medicare Other | Admitting: Physician Assistant

## 2020-10-27 ENCOUNTER — Other Ambulatory Visit: Payer: Self-pay | Admitting: Physician Assistant

## 2020-10-27 DIAGNOSIS — I10 Essential (primary) hypertension: Secondary | ICD-10-CM

## 2020-10-29 ENCOUNTER — Other Ambulatory Visit: Payer: Self-pay

## 2020-10-29 DIAGNOSIS — I10 Essential (primary) hypertension: Secondary | ICD-10-CM

## 2020-10-29 MED ORDER — AMLODIPINE BESYLATE 5 MG PO TABS: 5.0000 mg | ORAL_TABLET | Freq: Every day | ORAL | 1 refills | Status: DC

## 2021-03-22 ENCOUNTER — Ambulatory Visit (INDEPENDENT_AMBULATORY_CARE_PROVIDER_SITE_OTHER): Payer: Medicare Other | Admitting: Physician Assistant

## 2021-03-22 ENCOUNTER — Encounter: Payer: Self-pay | Admitting: Physician Assistant

## 2021-03-22 ENCOUNTER — Telehealth: Payer: Self-pay

## 2021-03-22 ENCOUNTER — Other Ambulatory Visit: Payer: Self-pay

## 2021-03-22 DIAGNOSIS — Z0100 Encounter for examination of eyes and vision without abnormal findings: Secondary | ICD-10-CM | POA: Diagnosis not present

## 2021-03-22 DIAGNOSIS — I10 Essential (primary) hypertension: Secondary | ICD-10-CM | POA: Diagnosis not present

## 2021-03-22 DIAGNOSIS — E039 Hypothyroidism, unspecified: Secondary | ICD-10-CM | POA: Diagnosis not present

## 2021-03-22 DIAGNOSIS — R5383 Other fatigue: Secondary | ICD-10-CM | POA: Diagnosis not present

## 2021-03-22 DIAGNOSIS — I700 Atherosclerosis of aorta: Secondary | ICD-10-CM | POA: Diagnosis not present

## 2021-03-22 DIAGNOSIS — R7303 Prediabetes: Secondary | ICD-10-CM | POA: Diagnosis not present

## 2021-03-22 DIAGNOSIS — I714 Abdominal aortic aneurysm, without rupture, unspecified: Secondary | ICD-10-CM | POA: Diagnosis not present

## 2021-03-22 MED ORDER — LISINOPRIL 2.5 MG PO TABS: 2.5000 mg | ORAL_TABLET | Freq: Every day | ORAL | 2 refills | Status: DC

## 2021-03-22 NOTE — Progress Notes (Signed)
[facility] ?[address] ?[place], [place] [address] ? ?Internal MEDICINE  ?Office Visit Note ? ?Patient Name: Jeremiah Price ? [id number]  [id number] ? ?Date of Service: 03/22/2021 ? ?Chief Complaint  ?Patient presents with  ? Follow-up  ? Hypertension  ? ? ?HPI ?Patient is here for routine follow-up ?- He had a practitioner come out to his home and checked his A1c which was found to be 6.1 which is in prediabetic range.  We will need to continue to monitor this and he was given education about diet ?- At last visit it was discussed that he had a AAA on a previous CT done by an outside provider and no follow-up had been completed therefore wanted to order an ultrasound to recheck this.  Unfortunately patient had to cancel this and has not had any further follow-up.  Discussed the importance of ensuring that this is not increasing in size and therefore we will have him see vascular surgery for further monitoring. ?- He is tolerating the Crestor well and takes this daily ?- He is overdue for an eye exam and request referral today ?- He declines being referred for hearing assessment at this time and states that he is doing okay ?-BP was 130/85 when checked by practitioner at home however is elevated on exam today.  Discussed adding low-dose lisinopril for renal protection due to prediabetes as well as controlling blood pressure better especially given AAA.  Patient advised to monitor blood pressures closely at home ?-Due for routine fasting labs and will have these done prior to CPE next visit ? ?Current Medication: ?Outpatient Encounter Medications as of 03/22/2021  ?Medication Sig  ? acetaminophen (TYLENOL) 500 MG tablet Take 500 mg by mouth every 6 (six) hours as needed.  ? amLODipine (NORVASC) 5 MG tablet Take 1 tablet (5 mg total) by mouth daily.  ? bisoprolol-hydrochlorothiazide (ZIAC) 10-6.25 MG tablet Take 1 tablet by mouth daily.  ? EUTHYROX 50 MCG tablet TAKE 1 TABLET BY MOUTH ONCE DAILY BEFORE  BREAKFAST  ? lisinopril (ZESTRIL) 2.5 MG tablet Take 1 tablet (2.5 mg total) by mouth daily.  ? pneumococcal 23 valent vaccine (PNEUMOVAX 23) 25 MCG/0.5ML injection Inject 0.12m IM once  ? rosuvastatin (CRESTOR) 5 MG tablet Take 1 tablet (5 mg total) by mouth daily.  ? ?No facility-administered encounter medications on file as of 03/22/2021.  ? ? ?Surgical History: ?Past Surgical History:  ?Procedure Laterality Date  ? CHOLECYSTECTOMY    ? ? ?Medical History: ?Past Medical History:  ?Diagnosis Date  ? Hypertension   ? Hypothyroidism   ? Vertigo   ? ? ?Family History: ?Family History  ?Problem Relation Age of Onset  ? CAD Neg Hx   ? ? ?Social History  ? ?Socioeconomic History  ? Marital status: Married  ?  Spouse name: Not on file  ? Number of children: Not on file  ? Years of education: Not on file  ? Highest education level: Not on file  ?Occupational History  ? Not on file  ?Tobacco Use  ? Smoking status: Former  ? Smokeless tobacco: Never  ? Tobacco comments:  ?  quit >30 years ago  ?Substance and Sexual Activity  ? Alcohol use: Yes  ?  Comment: occasionally  ? Drug use: No  ?  Comment: Quit >40 years ago  ? Sexual activity: Not on file  ?Other Topics Concern  ? Not on file  ?Social History Narrative  ? Active and independent at baseline  ? ?Social  Determinants of Health  ? ?Financial Resource Strain: Not on file  ?Food Insecurity: Not on file  ?Transportation Needs: Not on file  ?Physical Activity: Not on file  ?Stress: Not on file  ?Social Connections: Not on file  ?Intimate Partner Violence: Not on file  ? ? ? ? ?Review of Systems  ?Constitutional:  Negative for chills, fatigue and unexpected weight change.  ?HENT:  Negative for congestion, postnasal drip, rhinorrhea, sneezing and sore throat.   ?Eyes:  Negative for redness.  ?Respiratory:  Negative for cough, chest tightness and shortness of breath.   ?Cardiovascular:  Negative for chest pain and palpitations.  ?Gastrointestinal:  Negative for abdominal pain,  constipation, diarrhea, nausea and vomiting.  ?Genitourinary:  Negative for dysuria and frequency.  ?Musculoskeletal:  Negative for back pain, joint swelling and neck pain.  ?Skin:  Negative for rash.  ?Neurological: Negative.  Negative for tremors and numbness.  ?Hematological:  Negative for adenopathy. Does not bruise/bleed easily.  ?Psychiatric/Behavioral:  Negative for behavioral problems (Depression), sleep disturbance and suicidal ideas. The patient is not nervous/anxious.   ? ?Vital Signs: ?BP (!) 140/93   Pulse 67   Temp 98 ?F (36.7 ?C)   Resp 16   Ht '6\' 3"'$  (1.905 m)   Wt 211 lb 3.2 oz (95.8 kg)   SpO2 99%   BMI 26.40 kg/m?  ? ? ?Physical Exam ?Vitals and nursing note reviewed.  ?Constitutional:   ?   General: He is not in acute distress. ?   Appearance: He is well-developed. He is not diaphoretic.  ?HENT:  ?   Head: Normocephalic and atraumatic.  ?   Mouth/Throat:  ?   Pharynx: No oropharyngeal exudate.  ?Eyes:  ?   Pupils: Pupils are equal, round, and reactive to light.  ?Neck:  ?   Thyroid: No thyromegaly.  ?   Vascular: [name].  ?   Trachea: No tracheal deviation.  ?Cardiovascular:  ?   Rate and Rhythm: Normal rate and regular rhythm.  ?   Heart sounds: Normal heart sounds. No murmur heard. ?  No friction rub. No gallop.  ?Pulmonary:  ?   Effort: Pulmonary effort is normal. No respiratory distress.  ?   Breath sounds: [name] or rales.  ?Chest:  ?   Chest wall: No tenderness.  ?Abdominal:  ?   General: Bowel sounds are normal.  ?   Palpations: Abdomen is soft.  ?Musculoskeletal:     ?   General: Normal range of motion.  ?   Cervical back: Normal range of motion and neck supple.  ?Lymphadenopathy:  ?   Cervical: No cervical adenopathy.  ?Skin: ?   General: Skin is warm and dry.  ?Neurological:  ?   Mental Status: He is alert and oriented to person, place, and time.  ?   Cranial Nerves: No cranial nerve deficit.  ?Psychiatric:     ?   Behavior: Behavior normal.     ?   Thought Content: Thought  content normal.     ?   Judgment: Judgment normal.  ? ? ? ? ? ?Assessment/Plan: ?1. Essential hypertension ?Borderline BP in office, will add low-dose lisinopril and continue current medications.  Patient will monitor blood pressure closely at home and call if any problems ?- lisinopril (ZESTRIL) 2.5 MG tablet; Take 1 tablet (2.5 mg total) by mouth daily.  Dispense: 30 tablet; Refill: 2 ? ?2. Prediabetes ?A1c found to be 6.1 when practitioner came to his home on March 3.  Discussed working on diet and exercise and will continue to monitor ? ?3. Abdominal aortic aneurysm (AAA) without rupture, unspecified part ?Unfortunately patient has not had further monitoring since originally seen on CT in 2018 by outside provider.  Patient was unable to complete ultrasound previously ordered in office therefore will refer to vascular surgery for further monitoring and evaluation ?- Ambulatory referral to Vascular Surgery ? ?4. Acquired hypothyroidism ?Continue current medication and will update labs and adjust dose as indicated ?- TSH + free T4 ? ?5. Aortic atherosclerosis ([place]) ?Continue Crestor, will update labs ?- Lipid Panel With LDL/HDL Ratio ? ?6. Encounter for vision screening ?- Ambulatory referral to Ophthalmology ? ?7. Screening for prostate cancer ?- PSA Total (Reflex To Free) ? ?8. Other fatigue ?- CBC w/Diff/Platelet ?- Comprehensive metabolic panel ?- TSH + free T4 ?- Lipid Panel With LDL/HDL Ratio ?- PSA Total (Reflex To Free) ? ? ?General Counseling: Casimiro Needle understanding of the findings of todays visit and agrees with plan of treatment. I have discussed any further diagnostic evaluation that may be needed or ordered today. We also reviewed his medications today. he has been encouraged to call the office with any questions or concerns that should arise related to todays visit. ? ? ? ?Orders Placed This Encounter  ?Procedures  ? CBC w/Diff/Platelet  ? Comprehensive metabolic panel  ? TSH + free T4  ?  Lipid Panel With LDL/HDL Ratio  ? PSA Total (Reflex To Free)  ? Ambulatory referral to Ophthalmology  ? Ambulatory referral to Vascular Surgery  ? ? ?Meds ordered this encounter  ?Medications  ? lisinopril (ZESTR

## 2021-03-22 NOTE — Patient Instructions (Signed)
Prediabetes Eating Plan °Prediabetes is a condition that causes blood sugar (glucose) levels to be higher than normal. This increases the risk for developing type 2 diabetes (type 2 diabetes mellitus). Working with a health care provider or nutrition specialist (dietitian) to make diet and lifestyle changes can help prevent the onset of diabetes. These changes may help you: °Control your blood glucose levels. °Improve your cholesterol levels. °Manage your blood pressure. °What are tips for following this plan? °Reading food labels °Read food labels to check the amount of fat, salt (sodium), and sugar in prepackaged foods. Avoid foods that have: °Saturated fats. °Trans fats. °Added sugars. °Avoid foods that have more than 300 milligrams (mg) of sodium per serving. Limit your sodium intake to less than 2,300 mg each day. °Shopping °Avoid buying pre-made and processed foods. °Avoid buying drinks with added sugar. °Cooking °Cook with olive oil. Do not use butter, lard, or ghee. °Bake, broil, grill, steam, or boil foods. Avoid frying. °Meal planning ° °Work with your dietitian to create an eating plan that is right for you. This may include tracking how many calories you take in each day. Use a food diary, notebook, or mobile application to track what you eat at each meal. °Consider following a Mediterranean diet. This includes: °Eating several servings of fresh fruits and vegetables each day. °Eating fish at least twice a week. °Eating one serving each day of whole grains, beans, nuts, and seeds. °Using olive oil instead of other fats. °Limiting alcohol. °Limiting red meat. °Using nonfat or low-fat dairy products. °Consider following a plant-based diet. This includes dietary choices that focus on eating mostly vegetables and fruit, grains, beans, nuts, and seeds. °If you have high blood pressure, you may need to limit your sodium intake or follow a diet such as the DASH (Dietary Approaches to Stop Hypertension) eating  plan. The DASH diet aims to lower high blood pressure. °Lifestyle °Set weight loss goals with help from your health care team. It is recommended that most people with prediabetes lose 7% of their body weight. °Exercise for at least 30 minutes 5 or more days a week. °Attend a support group or seek support from a mental health counselor. °Take over-the-counter and prescription medicines only as told by your health care provider. °What foods are recommended? °Fruits °Berries. Bananas. Apples. Oranges. Grapes. Papaya. Mango. Pomegranate. Kiwi. Grapefruit. Cherries. °Vegetables °Lettuce. Spinach. Peas. Beets. Cauliflower. Cabbage. Broccoli. Carrots. Tomatoes. Squash. Eggplant. Herbs. Peppers. Onions. Cucumbers. Brussels sprouts. °Grains °Whole grains, such as whole-wheat or whole-grain breads, crackers, cereals, and pasta. Unsweetened oatmeal. Bulgur. Barley. Quinoa. Brown rice. Corn or whole-wheat flour tortillas or taco shells. °Meats and other proteins °Seafood. Poultry without skin. Lean cuts of pork and beef. Tofu. Eggs. Nuts. Beans. °Dairy °Low-fat or fat-free dairy products, such as yogurt, cottage cheese, and cheese. °Beverages °Water. Tea. Coffee. Sugar-free or diet soda. Seltzer water. Low-fat or nonfat milk. Milk alternatives, such as soy or almond milk. °Fats and oils °Olive oil. Canola oil. Sunflower oil. Grapeseed oil. Avocado. Walnuts. °Sweets and desserts °Sugar-free or low-fat pudding. Sugar-free or low-fat ice cream and other frozen treats. °Seasonings and condiments °Herbs. Sodium-free spices. Mustard. Relish. Low-salt, low-sugar ketchup. Low-salt, low-sugar barbecue sauce. Low-fat or fat-free mayonnaise. °The items listed above may not be a complete list of recommended foods and beverages. Contact a dietitian for more information. °What foods are not recommended? °Fruits °Fruits canned with syrup. °Vegetables °Canned vegetables. Frozen vegetables with butter or cream sauce. °Grains °Refined white  flour and flour   products, such as bread, pasta, snack foods, and cereals. °Meats and other proteins °Fatty cuts of meat. Poultry with skin. Breaded or fried meat. Processed meats. °Dairy °Full-fat yogurt, cheese, or milk. °Beverages °Sweetened drinks, such as iced tea and soda. °Fats and oils °Butter. Lard. Ghee. °Sweets and desserts °Baked goods, such as cake, cupcakes, pastries, cookies, and cheesecake. °Seasonings and condiments °Spice mixes with added salt. Ketchup. Barbecue sauce. Mayonnaise. °The items listed above may not be a complete list of foods and beverages that are not recommended. Contact a dietitian for more information. °Where to find more information °American Diabetes Association: www.diabetes.org °Summary °You may need to make diet and lifestyle changes to help prevent the onset of diabetes. These changes can help you control blood sugar, improve cholesterol levels, and manage blood pressure. °Set weight loss goals with help from your health care team. It is recommended that most people with prediabetes lose 7% of their body weight. °Consider following a Mediterranean diet. This includes eating plenty of fresh fruits and vegetables, whole grains, beans, nuts, seeds, fish, and low-fat dairy, and using olive oil instead of other fats. °This information is not intended to replace advice given to you by your health care provider. Make sure you discuss any questions you have with your health care provider. °Document Revised: 03/25/2019 Document Reviewed: 03/25/2019 °Elsevier Patient Education © 2022 Elsevier Inc. ° °

## 2021-03-22 NOTE — Telephone Encounter (Signed)
Awaiting 03/22/21 office notes for ophthalmology referral-Toni ?

## 2021-03-23 ENCOUNTER — Encounter: Payer: Self-pay | Admitting: Physician Assistant

## 2021-03-23 NOTE — Telephone Encounter (Signed)
Referral sent via [facility] to [facility] ?

## 2021-03-28 ENCOUNTER — Encounter: Payer: Self-pay | Admitting: Physician Assistant

## 2021-04-18 ENCOUNTER — Encounter: Payer: Self-pay | Admitting: Internal Medicine

## 2021-05-04 ENCOUNTER — Telehealth: Payer: Self-pay

## 2021-05-04 NOTE — Telephone Encounter (Signed)
Left vm to confirm 05/11/21 appointment-Toni ?

## 2021-05-07 ENCOUNTER — Telehealth: Payer: Self-pay

## 2021-05-07 NOTE — Telephone Encounter (Signed)
Per Nevin Bloodgood w/ [facility], referral decline due to patient not returning calls to schedule appointment-Toni ?

## 2021-05-11 ENCOUNTER — Encounter: Payer: Self-pay | Admitting: Physician Assistant

## 2021-05-11 ENCOUNTER — Ambulatory Visit (INDEPENDENT_AMBULATORY_CARE_PROVIDER_SITE_OTHER): Payer: Medicare Other | Admitting: Physician Assistant

## 2021-05-11 DIAGNOSIS — R7303 Prediabetes: Secondary | ICD-10-CM | POA: Diagnosis not present

## 2021-05-11 DIAGNOSIS — I714 Abdominal aortic aneurysm, without rupture, unspecified: Secondary | ICD-10-CM | POA: Diagnosis not present

## 2021-05-11 DIAGNOSIS — I700 Atherosclerosis of aorta: Secondary | ICD-10-CM | POA: Diagnosis not present

## 2021-05-11 DIAGNOSIS — R300 Dysuria: Secondary | ICD-10-CM

## 2021-05-11 DIAGNOSIS — Z0100 Encounter for examination of eyes and vision without abnormal findings: Secondary | ICD-10-CM

## 2021-05-11 DIAGNOSIS — I10 Essential (primary) hypertension: Secondary | ICD-10-CM

## 2021-05-11 DIAGNOSIS — E039 Hypothyroidism, unspecified: Secondary | ICD-10-CM | POA: Diagnosis not present

## 2021-05-11 DIAGNOSIS — Z0001 Encounter for general adult medical examination with abnormal findings: Secondary | ICD-10-CM | POA: Diagnosis not present

## 2021-05-11 LAB — POCT GLYCOSYLATED HEMOGLOBIN (HGB A1C): Hemoglobin A1C: 5.8 % — AB (ref 4.0–5.6)

## 2021-05-11 MED ORDER — LISINOPRIL 2.5 MG PO TABS: 2.5000 mg | ORAL_TABLET | Freq: Every day | ORAL | 3 refills | Status: DC

## 2021-05-11 MED ORDER — TETANUS-DIPHTH-ACELL PERTUSSIS 5-2.5-18.5 LF-MCG/0.5 IM SUSY: 0.5000 mL | PREFILLED_SYRINGE | Freq: Once | INTRAMUSCULAR | 0 refills | Status: AC

## 2021-05-11 NOTE — Progress Notes (Signed)
?[facility] ?[address] ?[place], [place] [address] ? ?Internal MEDICINE  ?Office Visit Note ? ?Patient Name: Jeremiah Price ? [id number]  ?[id number] ? ?Date of Service: 05/11/2021 ? ?Chief Complaint  ?Patient presents with  ? Hypertension  ? Medicare Wellness  ? prediabetic  ? ? ? ?HPI ?Pt is here for routine health maintenance examination and has no concerns today ?-Labs not done and will do these now ?-Has vascular appt to follow AAA in June and again discussed the importance of attending this appt ?-Doesn't remember getting call from eye doctor, however per record his referral was closed due to no return calls. Will refer again and have pt look out for call/call directly himself ?-A1c down to 5.8 from 6.1 at home visit ?-BP at home checked sometimes and does recall one high reading, however thinks it may have been prior to taking his meds. His BP at last home check with provider was well controlled and is borderline in office today on recheck. He will monitor closely.  ?-he declines PNA or shingles vaccines. May consider tdap and therefore will send to pharmacy. ?-UTD on colonoscopy ? ?Current Medication: ?Outpatient Encounter Medications as of 05/11/2021  ?Medication Sig  ? [DISCONTINUED] Tdap (BOOSTRIX) 5-2.5-18.5 LF-MCG/0.5 injection Inject 0.5 mLs into the muscle once.  ? acetaminophen (TYLENOL) 500 MG tablet Take 500 mg by mouth every 6 (six) hours as needed.  ? amLODipine (NORVASC) 5 MG tablet Take 1 tablet (5 mg total) by mouth daily.  ? EUTHYROX 50 MCG tablet TAKE 1 TABLET BY MOUTH ONCE DAILY BEFORE BREAKFAST  ? lisinopril (ZESTRIL) 2.5 MG tablet Take 1 tablet (2.5 mg total) by mouth daily.  ? rosuvastatin (CRESTOR) 5 MG tablet Take 1 tablet (5 mg total) by mouth daily.  ? Tdap (BOOSTRIX) 5-2.5-18.5 LF-MCG/0.5 injection Inject 0.5 mLs into the muscle once for 1 dose.  ? [DISCONTINUED] bisoprolol-hydrochlorothiazide (ZIAC) 10-6.25 MG tablet Take 1 tablet by mouth daily.  ? [DISCONTINUED]  lisinopril (ZESTRIL) 2.5 MG tablet Take 1 tablet (2.5 mg total) by mouth daily.  ? [DISCONTINUED] pneumococcal 23 valent vaccine (PNEUMOVAX 23) 25 MCG/0.5ML injection Inject 0.62m IM once  ? ?No facility-administered encounter medications on file as of 05/11/2021.  ? ? ?Surgical History: ?Past Surgical History:  ?Procedure Laterality Date  ? CHOLECYSTECTOMY    ? ? ?Medical History: ?Past Medical History:  ?Diagnosis Date  ? Hypertension   ? Hypothyroidism   ? Vertigo   ? ? ?Family History: ?Family History  ?Problem Relation Age of Onset  ? CAD Neg Hx   ? ? ? ? ?Review of Systems  ?Constitutional:  Negative for chills, fatigue and unexpected weight change.  ?HENT:  Negative for congestion, postnasal drip, rhinorrhea, sneezing and sore throat.   ?Eyes:  Negative for redness.  ?Respiratory:  Negative for cough, chest tightness and shortness of breath.   ?Cardiovascular:  Negative for chest pain and palpitations.  ?Gastrointestinal:  Negative for abdominal pain, constipation, diarrhea, nausea and vomiting.  ?Genitourinary:  Negative for dysuria and frequency.  ?Musculoskeletal:  Negative for back pain, joint swelling and neck pain.  ?Skin:  Negative for rash.  ?Neurological: Negative.  Negative for tremors and numbness.  ?Hematological:  Negative for adenopathy. Does not bruise/bleed easily.  ?Psychiatric/Behavioral:  Negative for behavioral problems (Depression), sleep disturbance and suicidal ideas. The patient is not nervous/anxious.   ? ? ?Vital Signs: ?BP 140/80 Comment: 132/109  Pulse 80   Temp 97.8 ?F (36.6 ?C)   Resp 16  Ht '6\' 3"'$  (1.905 m)   Wt 211 lb 3.2 oz (95.8 kg)   SpO2 97%   BMI 26.40 kg/m?  ? ? ?Physical Exam ?Vitals and nursing note reviewed.  ?Constitutional:   ?   General: He is not in acute distress. ?   Appearance: He is well-developed. He is not diaphoretic.  ?HENT:  ?   Head: Normocephalic and atraumatic.  ?   Mouth/Throat:  ?   Pharynx: No oropharyngeal exudate.  ?Eyes:  ?   Pupils: Pupils  are equal, round, and reactive to light.  ?Neck:  ?   Thyroid: No thyromegaly.  ?   Vascular: [name].  ?   Trachea: No tracheal deviation.  ?Cardiovascular:  ?   Rate and Rhythm: Normal rate and regular rhythm.  ?   Heart sounds: Normal heart sounds. No murmur heard. ?  No friction rub. No gallop.  ?Pulmonary:  ?   Effort: Pulmonary effort is normal. No respiratory distress.  ?   Breath sounds: [name] or rales.  ?Chest:  ?   Chest wall: No tenderness.  ?Abdominal:  ?   General: Bowel sounds are normal.  ?   Palpations: Abdomen is soft.  ?   Tenderness: There is abdominal tenderness.  ?Musculoskeletal:     ?   General: Normal range of motion.  ?   Cervical back: Normal range of motion and neck supple.  ?   Right lower leg: [name].  ?   Left lower leg: [name].  ?Lymphadenopathy:  ?   Cervical: No cervical adenopathy.  ?Skin: ?   General: Skin is warm and dry.  ?Neurological:  ?   Mental Status: He is alert and oriented to person, place, and time.  ?   Cranial Nerves: No cranial nerve deficit.  ?Psychiatric:     ?   Behavior: Behavior normal.     ?   Thought Content: Thought content normal.     ?   Judgment: Judgment normal.  ? ? ? ?LABS: ?Recent Results (from the past 2160 hour(s))  ?POCT glycosylated hemoglobin (Hb A1C)     Status: Abnormal  ? Collection Time: 05/11/21 10:07 AM  ?Result Value Ref Range  ? Hemoglobin A1C 5.8 (A) 4.0 - 5.6 %  ? HbA1c POC (<> result, manual entry)    ? HbA1c, POC (prediabetic range)    ? HbA1c, POC (controlled diabetic range)    ? ? ? ? ? ? ?Assessment/Plan: ?1. Encounter for general adult medical examination with abnormal findings ?CPE performed, routine fasting labs previously ordered and patient will have these done now.  Up-to-date on colonoscopy ? ?2. Essential hypertension ?Stable, continue current medications and home monitoring ?- lisinopril (ZESTRIL) 2.5 MG tablet; Take 1 tablet (2.5 mg total) by mouth daily.  Dispense: 90 tablet; Refill: 3 ? ?3. Prediabetes ?- POCT  glycosylated hemoglobin (Hb A1C) is 5.8 which is improved from 6.1 at provider and home screening a few months ago.  We will continue to monitor and work on diet and exercise ? ?4. Acquired hypothyroidism ?Patient will go for updated labs and will adjust Euthyrox dose as indicated ? ?5. Abdominal aortic aneurysm (AAA) without rupture, unspecified part ([place]) ?Has upcoming appointment with vascular in June and was again counseled on the importance of maintaining this appointment as he needs follow-up for known AAA ? ?6. Aortic atherosclerosis ([place]) ?Continue Crestor ? ?7. Eye exam, routine ?- Ambulatory referral to Ophthalmology ? ?8. Dysuria ?- UA/M w/rflx Culture,  Routine ? ? ?General Counseling: Casimiro Needle understanding of the findings of todays visit and agrees with plan of treatment. I have discussed any further diagnostic evaluation that may be needed or ordered today. We also reviewed his medications today. he has been encouraged to call the office with any questions or concerns that should arise related to todays visit. ? ? ? ?Counseling: ? ? ? ?Orders Placed This Encounter  ?Procedures  ? UA/M w/rflx Culture, Routine  ? Ambulatory referral to Ophthalmology  ? POCT glycosylated hemoglobin (Hb A1C)  ? ? ?Meds ordered this encounter  ?Medications  ? lisinopril (ZESTRIL) 2.5 MG tablet  ?  Sig: Take 1 tablet (2.5 mg total) by mouth daily.  ?  Dispense:  90 tablet  ?  Refill:  3  ? Tdap (BOOSTRIX) 5-2.5-18.5 LF-MCG/0.5 injection  ?  Sig: Inject 0.5 mLs into the muscle once for 1 dose.  ?  Dispense:  0.5 mL  ?  Refill:  0  ? ? ?This patient was seen by Drema Dallas, PA-C in collaboration with Dr. Clayborn Bigness as a part of collaborative care agreement. ? ?Total time spent:35 Minutes ? ?Time spent includes review of chart, medications, test results, and follow up plan with the patient.  ? ? ? ?Lavera Guise, MD ? ?Internal Medicine ? ?

## 2021-05-12 LAB — UA/M W/RFLX CULTURE, ROUTINE
Bilirubin, UA: NEGATIVE
Glucose, UA: NEGATIVE
Leukocytes,UA: NEGATIVE
Nitrite, UA: NEGATIVE
RBC, UA: NEGATIVE
Specific Gravity, UA: 1.024 (ref 1.005–1.030)
Urobilinogen, Ur: 1 mg/dL (ref 0.2–1.0)
pH, UA: 5 (ref 5.0–7.5)

## 2021-05-12 LAB — MICROSCOPIC EXAMINATION
Bacteria, UA: NONE SEEN
Casts: NONE SEEN /lpf
Epithelial Cells (non renal): NONE SEEN /hpf (ref 0–10)
RBC, Urine: NONE SEEN /hpf (ref 0–2)
WBC, UA: NONE SEEN /hpf (ref 0–5)

## 2021-05-15 ENCOUNTER — Telehealth: Payer: Self-pay

## 2021-05-15 ENCOUNTER — Ambulatory Visit (INDEPENDENT_AMBULATORY_CARE_PROVIDER_SITE_OTHER): Payer: Medicare Other | Admitting: Nurse Practitioner

## 2021-05-15 ENCOUNTER — Encounter: Payer: Self-pay | Admitting: Nurse Practitioner

## 2021-05-15 VITALS — BP 137/88 | HR 66 | Temp 97.6°F | Resp 16 | Ht 75.0 in | Wt 209.8 lb

## 2021-05-15 DIAGNOSIS — R519 Headache, unspecified: Secondary | ICD-10-CM

## 2021-05-15 NOTE — Progress Notes (Signed)
[facility] [place], [place] [address]  Internal MEDICINE  Office Visit Note  Patient Name: Jeremiah Price  [id number]  [id number]  Date of Service: 07/09/2021  Chief Complaint  Patient presents with   Headache    Has been having sharp pain on left side of left - inconsistent, pain lasts for several seconds, last night was most recent episode of this happening     HPI Iva presents for an acute sick visit for experiencing a sharp pain on the left side of his head lasting for 8 to 10 seconds at a time.  There were a total of 3 episodes last night.  He reports that this happened in bed and woke him up out of his deep sleep.  He denies any other associated symptoms and denies any radiation of pain to other areas and rates the pain a 10 out of 10 on the pain scale and the pain was episodic but continuous during those 8 to 10 seconds that he experienced it.  He denies experiencing any changes in vision, nausea, vomiting, sensitivity to light or sound, slurred speech, numbness, generalized weakness or unilateral muscle weakness or paralysis of extremities or part of the face.    Current Medication:  Outpatient Encounter Medications as of 05/15/2021  Medication Sig   acetaminophen (TYLENOL) 500 MG tablet Take 500 mg by mouth every 6 (six) hours as needed.   amLODipine (NORVASC) 5 MG tablet Take 1 tablet (5 mg total) by mouth daily.   lisinopril (ZESTRIL) 2.5 MG tablet Take 1 tablet (2.5 mg total) by mouth daily.   rosuvastatin (CRESTOR) 5 MG tablet Take 1 tablet (5 mg total) by mouth daily.   [DISCONTINUED] EUTHYROX 50 MCG tablet TAKE 1 TABLET BY MOUTH ONCE DAILY BEFORE BREAKFAST   No facility-administered encounter medications on file as of 05/15/2021.      Medical History: Past Medical History:  Diagnosis Date   Hypertension    Hypothyroidism    Vertigo      Vital Signs: BP 137/88   Pulse 66   Temp 97.6 F (36.4 C)   Resp 16   Ht '6\' 3"'$  (1.905 m)    Wt 209 lb 12.8 oz (95.2 kg)   SpO2 99%   BMI 26.22 kg/m    Review of Systems  Constitutional:  Negative for fatigue.  HENT:  Negative for drooling, ear pain, hearing loss and nosebleeds.   Eyes:  Negative for photophobia and visual disturbance.  Respiratory: Negative.  Negative for cough, chest tightness, shortness of breath and wheezing.   Cardiovascular: Negative.  Negative for chest pain, palpitations and leg swelling.  Gastrointestinal: Negative.  Negative for abdominal pain, diarrhea, nausea and vomiting.  Musculoskeletal: Negative.  Negative for arthralgias, gait problem and myalgias.  Neurological:  Positive for headaches. Negative for dizziness, tremors, seizures, syncope, facial asymmetry, speech difficulty, weakness, light-headedness and numbness.  Psychiatric/Behavioral:  Positive for sleep disturbance. The patient is not nervous/anxious.     Physical Exam Vitals reviewed.  Constitutional:      General: He is not in acute distress.    Appearance: Normal appearance. He is well-developed. He is obese. He is not ill-appearing.  HENT:     Head: Normocephalic and atraumatic.  Eyes:     General: No visual field deficit.    Extraocular Movements: Extraocular movements intact.     Conjunctiva/sclera: Conjunctivae normal.     Pupils: Pupils are equal, round, and reactive to light.  Cardiovascular:  Rate and Rhythm: Normal rate and regular rhythm.     Heart sounds: Normal heart sounds. No murmur heard. Pulmonary:     Effort: Pulmonary effort is normal. No respiratory distress.     Breath sounds: Normal breath sounds. No stridor. [name].  Skin:    General: Skin is warm and dry.     Capillary Refill: Capillary refill takes less than 2 seconds.  Neurological:     Mental Status: He is alert and oriented to person, place, and time.     GCS: GCS eye subscore is 4. GCS verbal subscore is 5. GCS motor subscore is 6.     Cranial Nerves: No dysarthria or facial asymmetry.      Sensory: No sensory deficit.     Motor: No weakness.     Coordination: Coordination normal.     Gait: Gait normal.  Psychiatric:        Mood and Affect: Mood normal.        Behavior: Behavior normal.       Assessment/Plan: 1. Nonintractable episodic headache, unspecified headache type No red flag associated symptoms noted during history and exam. Patient is worried it could be something serious. Consulted with Dr. Clayborn Bigness. It is agreed that there is no need for further assessment unless patient develops additional symptoms or the pain occurs again and is more severe and lasts longer. No labs or imaging at this time but will consider at a later date if symptoms recur or he experiences additional symptoms.    General Counseling: Casimiro Needle understanding of the findings of todays visit and agrees with plan of treatment. I have discussed any further diagnostic evaluation that may be needed or ordered today. We also reviewed his medications today. he has been encouraged to call the office with any questions or concerns that should arise related to todays visit.    Counseling:    No orders of the defined types were placed in this encounter.   No orders of the defined types were placed in this encounter.   Return if symptoms worsen or fail to improve.  [place] Controlled Substance Database was reviewed by me for overdose risk score (ORS)  Time spent:30 Minutes Time spent with patient included reviewing progress notes, labs, imaging studies, and discussing plan for follow up.   This patient was seen by Jonetta Osgood, FNP-C in collaboration with Dr. Clayborn Bigness as a part of collaborative care agreement.  Tekesha Almgren R. Valetta Fuller, MSN, FNP-C Internal Medicine

## 2021-05-15 NOTE — Telephone Encounter (Signed)
Resent Ophthalmology referral to [facility] via [facility]. Gave patient their telephone # to call and schedule appointment-Toni ?

## 2021-05-22 ENCOUNTER — Telehealth: Payer: Self-pay

## 2021-05-22 NOTE — Telephone Encounter (Signed)
[name] w/ [facility], patient declined to schedule appointment. Stated he went somewhere else-Toni ?

## 2021-05-25 ENCOUNTER — Telehealth: Payer: Self-pay

## 2021-05-28 NOTE — Telephone Encounter (Signed)
error 

## 2021-06-05 ENCOUNTER — Telehealth: Payer: Self-pay

## 2021-06-06 ENCOUNTER — Other Ambulatory Visit: Payer: Self-pay

## 2021-06-06 DIAGNOSIS — E782 Mixed hyperlipidemia: Secondary | ICD-10-CM

## 2021-06-06 DIAGNOSIS — E038 Other specified hypothyroidism: Secondary | ICD-10-CM

## 2021-06-06 DIAGNOSIS — R5383 Other fatigue: Secondary | ICD-10-CM

## 2021-06-06 DIAGNOSIS — E559 Vitamin D deficiency, unspecified: Secondary | ICD-10-CM

## 2021-06-06 DIAGNOSIS — R6889 Other general symptoms and signs: Secondary | ICD-10-CM

## 2021-06-06 DIAGNOSIS — E538 Deficiency of other specified B group vitamins: Secondary | ICD-10-CM

## 2021-06-08 ENCOUNTER — Other Ambulatory Visit: Payer: Self-pay | Admitting: Internal Medicine

## 2021-06-08 DIAGNOSIS — E039 Hypothyroidism, unspecified: Secondary | ICD-10-CM

## 2021-06-11 ENCOUNTER — Other Ambulatory Visit: Payer: Self-pay

## 2021-06-11 DIAGNOSIS — E039 Hypothyroidism, unspecified: Secondary | ICD-10-CM

## 2021-06-11 MED ORDER — LEVOTHYROXINE SODIUM 50 MCG PO TABS: 50.0000 ug | ORAL_TABLET | Freq: Every day | ORAL | 1 refills | Status: DC

## 2021-06-12 ENCOUNTER — Other Ambulatory Visit (INDEPENDENT_AMBULATORY_CARE_PROVIDER_SITE_OTHER): Payer: Self-pay | Admitting: Nurse Practitioner

## 2021-06-12 DIAGNOSIS — I714 Abdominal aortic aneurysm, without rupture, unspecified: Secondary | ICD-10-CM

## 2021-06-13 ENCOUNTER — Encounter (INDEPENDENT_AMBULATORY_CARE_PROVIDER_SITE_OTHER): Payer: Self-pay | Admitting: Nurse Practitioner

## 2021-06-13 ENCOUNTER — Ambulatory Visit (INDEPENDENT_AMBULATORY_CARE_PROVIDER_SITE_OTHER): Payer: Medicare Other

## 2021-06-13 ENCOUNTER — Ambulatory Visit (INDEPENDENT_AMBULATORY_CARE_PROVIDER_SITE_OTHER): Payer: Medicare Other | Admitting: Nurse Practitioner

## 2021-06-13 VITALS — BP 162/106 | HR 85 | Resp 16 | Wt 207.0 lb

## 2021-06-13 DIAGNOSIS — I7143 Infrarenal abdominal aortic aneurysm, without rupture: Secondary | ICD-10-CM

## 2021-06-13 DIAGNOSIS — I10 Essential (primary) hypertension: Secondary | ICD-10-CM

## 2021-06-13 DIAGNOSIS — I723 Aneurysm of iliac artery: Secondary | ICD-10-CM

## 2021-06-13 DIAGNOSIS — I714 Abdominal aortic aneurysm, without rupture, unspecified: Secondary | ICD-10-CM | POA: Diagnosis not present

## 2021-06-13 NOTE — Progress Notes (Signed)
Subjective:    Patient ID: Jeremiah Price, male    DOB: Oct 14, 1945, 75 y.o.   MRN: [id number] Chief Complaint  Patient presents with   New Patient (Initial Visit)    Ref [place] consult AAA w/o rupture    Iyan Flett is a 76 year old male that presents to [place] today for follow-up evaluation of an abdominal aortic aneurysm.  The patient had an abdominal aortic aneurysm was originally found on CT scan in 2018.  It originally measured 3.2 cm with an infrarenal abdominal aortic aneurysm.  The patient also had a 2.7 common iliac artery aneurysm and a 2.1 left common iliac artery aneurysm.  It was also noted that there was a 2.6 cm internal iliac artery aneurysm and a 2.1 left internal iliac artery aneurysm.  Currently denies any significant abdominal pain.  He denies any signs symptoms of distal embolization.  He has no symptoms of claudication or rest pain.  No TIA or CVA-like symptoms.  Today noninvasive studies show evidence of a 4.5 distal aortic artery aneurysm.  The patient has a 3.5 cm right common iliac artery aneurysm with a 3.1 left common iliac artery aneurysm.     Review of Systems  Cardiovascular:  Negative for leg swelling.  Gastrointestinal:  Negative for abdominal pain.  All other systems reviewed and are negative.     Objective:   Physical Exam Vitals reviewed.  HENT:     Head: Normocephalic.  Cardiovascular:     Rate and Rhythm: Normal rate and regular rhythm.     Pulses: Decreased pulses.     Heart sounds: Normal heart sounds.  Pulmonary:     Effort: Pulmonary effort is normal.  Skin:    General: Skin is warm and dry.  Neurological:     Mental Status: He is alert and oriented to person, place, and time.  Psychiatric:        Mood and Affect: Mood normal.        Behavior: Behavior normal.        Thought Content: Thought content normal.        Judgment: Judgment normal.    BP (!) 162/106 (BP Location: Left Arm)   Pulse 85   Resp 16   Wt 207 lb (93.9 kg)    BMI 25.87 kg/m   Past Medical History:  Diagnosis Date   Hypertension    Hypothyroidism    Vertigo     Social History   Socioeconomic History   Marital status: Married    Spouse name: Not on file   Number of children: Not on file   Years of education: Not on file   Highest education level: Not on file  Occupational History   Not on file  Tobacco Use   Smoking status: Former   Smokeless tobacco: Never   Tobacco comments:    quit >30 years ago  Substance and Sexual Activity   Alcohol use: Yes    Comment: occasionally   Drug use: No    Comment: Quit >40 years ago   Sexual activity: Not on file  Other Topics Concern   Not on file  Social History Narrative   Active and independent at baseline   Social Determinants of Health   Financial Resource Strain: Not on file  Food Insecurity: Not on file  Transportation Needs: Not on file  Physical Activity: Not on file  Stress: Not on file  Social Connections: Not on file  Intimate Partner Violence: Not on file  Past Surgical History:  Procedure Laterality Date   CHOLECYSTECTOMY      Family History  Problem Relation Age of Onset   CAD Neg Hx     No Known Allergies     Latest Ref Rng & Units 12/20/2019    1:55 PM 03/04/2016    4:40 AM 03/03/2016    9:34 AM  CBC  WBC 3.4 - 10.8 x10E3/uL 4.2   4.3   11.1    Hemoglobin 13.0 - 17.7 g/dL 14.1   12.8   14.5    Hematocrit 37.5 - 51.0 % 42.7   36.7   42.4    Platelets 150 - 450 x10E3/uL 293   361   449        CMP     Component Value Date/Time   NA 138 12/20/2019 1355   NA 139 10/25/2012 0636   K 4.3 12/20/2019 1355   K 3.9 10/25/2012 0636   CL 101 12/20/2019 1355   CL 107 10/25/2012 0636   CO2 25 12/20/2019 1355   CO2 28 10/25/2012 0636   GLUCOSE 93 12/20/2019 1355   GLUCOSE 142 (H) 03/05/2016 0330   GLUCOSE 125 (H) 10/25/2012 0636   BUN 13 12/20/2019 1355   BUN 11 10/25/2012 0636   CREATININE 0.91 12/20/2019 1355   CREATININE 0.92 10/25/2012 0636    CALCIUM 9.4 12/20/2019 1355   CALCIUM 8.8 10/25/2012 0636   PROT 7.5 12/20/2019 1355   PROT 7.8 10/24/2012 2200   ALBUMIN 4.1 12/20/2019 1355   ALBUMIN 3.6 10/24/2012 2200   AST 15 12/20/2019 1355   AST 28 10/24/2012 2200   ALT 8 12/20/2019 1355   ALT 24 10/24/2012 2200   ALKPHOS 73 12/20/2019 1355   ALKPHOS 72 10/24/2012 2200   BILITOT 0.3 12/20/2019 1355   BILITOT 0.3 10/24/2012 2200   GFRNONAA 84 12/20/2019 1355   GFRNONAA >60 10/25/2012 0636   GFRAA 97 12/20/2019 1355   GFRAA >60 10/25/2012 0636     No results found.     Assessment & Plan:   1. Infrarenal abdominal aortic aneurysm (AAA) without rupture (HCC) Currently the patient does have an abdominal aortic aneurysm that is measured at about 4.5 cm.  While this is slightly below the threshold for repair he has bilateral common iliac artery aneurysms that are above the repair threshold.  Endovascular treatment for his bilateral common iliac artery aneurysms will also include concurrent treatment of his abdominal aortic aneurysm. - CT Angio Abd/Pel w/ and/or w/o; Future  2. Iliac artery aneurysm (HCC) Recommend:  The patient has bilateral common iliac artery aneurysms that are 3.0 cm or greater by duplex scan and based on this study it appears that it is suitable for endovascular treatment.  The patient is otherwise in reasonable health.   Therefore, the patient should undergo endovascular repair of the iliac artery aneurysms to prevent future leathal rupture.   Patient will require CT angiography of the abdomen and pelvis in order to appropriately plan repair of the AAA.  The risks and benefits as well as the alternative therapies was discussed in detail with the patient. All questions were answered. The patient agrees to move forward the AAA repair.  Therefore a CT angiogram with be scheduled as an outpatient.  The patient will follow up with me in the office after the CT scan to review the study and finalize plan  for repair.  - CT Angio Abd/Pel w/ and/or w/o; Future  3. Essential hypertension Continue antihypertensive medications  as already ordered, these medications have been reviewed and there are no changes at this time.    Current Outpatient Medications on File Prior to Visit  Medication Sig Dispense Refill   acetaminophen (TYLENOL) 500 MG tablet Take 500 mg by mouth every 6 (six) hours as needed.     amLODipine (NORVASC) 5 MG tablet Take 1 tablet (5 mg total) by mouth daily. 90 tablet 1   levothyroxine (SYNTHROID) 50 MCG tablet Take 1 tablet (50 mcg total) by mouth daily before breakfast. 90 tablet 1   lisinopril (ZESTRIL) 2.5 MG tablet Take 1 tablet (2.5 mg total) by mouth daily. 90 tablet 3   rosuvastatin (CRESTOR) 5 MG tablet Take 1 tablet (5 mg total) by mouth daily. 90 tablet 3   No current facility-administered medications on file prior to visit.    There are no Patient Instructions on file for this visit. No follow-ups on file.   Kris Hartmann, NP

## 2021-06-22 NOTE — Telephone Encounter (Signed)
error 

## 2021-06-27 ENCOUNTER — Telehealth (INDEPENDENT_AMBULATORY_CARE_PROVIDER_SITE_OTHER): Payer: Self-pay | Admitting: Nurse Practitioner

## 2021-06-27 NOTE — Telephone Encounter (Signed)
Spoke with pt - gave # to Radiology Scheduling [phone] and advised pt to call and schedule CT that Eulogio Ditch, NP ordered. After he gets appt for CT, to call us back at [facility] and schedule a CT results appt. Nothing further needed at this time.

## 2021-07-09 ENCOUNTER — Encounter: Payer: Self-pay | Admitting: Nurse Practitioner

## 2021-07-24 ENCOUNTER — Ambulatory Visit: Admission: RE | Admit: 2021-07-24 | Payer: Medicare Other | Source: Ambulatory Visit

## 2021-08-20 ENCOUNTER — Telehealth: Payer: Self-pay

## 2021-08-20 ENCOUNTER — Other Ambulatory Visit: Payer: Self-pay | Admitting: Physician Assistant

## 2021-08-20 DIAGNOSIS — I10 Essential (primary) hypertension: Secondary | ICD-10-CM

## 2021-08-20 NOTE — Telephone Encounter (Signed)
Lvm to schedule 3 mo f/u-Toni

## 2021-08-20 NOTE — Telephone Encounter (Signed)
Left him vm to call back

## 2021-08-20 NOTE — Telephone Encounter (Signed)
3 months

## 2021-10-05 ENCOUNTER — Ambulatory Visit (INDEPENDENT_AMBULATORY_CARE_PROVIDER_SITE_OTHER): Payer: Medicare Other | Admitting: Physician Assistant

## 2021-10-05 ENCOUNTER — Encounter: Payer: Self-pay | Admitting: Physician Assistant

## 2021-10-05 VITALS — BP 166/96 | HR 65 | Temp 97.8°F | Resp 16 | Ht 75.0 in | Wt 212.4 lb

## 2021-10-05 DIAGNOSIS — I10 Essential (primary) hypertension: Secondary | ICD-10-CM

## 2021-10-05 DIAGNOSIS — I723 Aneurysm of iliac artery: Secondary | ICD-10-CM | POA: Diagnosis not present

## 2021-10-05 DIAGNOSIS — E039 Hypothyroidism, unspecified: Secondary | ICD-10-CM | POA: Diagnosis not present

## 2021-10-05 DIAGNOSIS — I7143 Infrarenal abdominal aortic aneurysm, without rupture: Secondary | ICD-10-CM | POA: Diagnosis not present

## 2021-10-05 MED ORDER — LISINOPRIL 5 MG PO TABS: 5.0000 mg | ORAL_TABLET | Freq: Every day | ORAL | 3 refills | Status: DC

## 2021-10-05 NOTE — Progress Notes (Signed)
[facility] [place], [place] [address]  Internal MEDICINE  Office Visit Note  Patient Name: Jeremiah Price  [id number]  [id number]  Date of Service: 10/16/2021  Chief Complaint  Patient presents with   Follow-up   Hypertension   Quality Metric Gaps    Tetanus vaccine    HPI Pt is here for routine follow up -BP has been elevated and will adjust meds to '5mg'$  lisinopril and continue amlodipine. Will monitor closely and may need further adjustment. -Has not had labs done yet and will go now -Went to the eye doctor due to vision being a little more fuzzy at nighttime. Was suggested to get glasses, but has not done so and will follow up on this. -Patient did have initial visit with vascular and US done to evaluate AAA which did show aneurysm just below surgical threshold, however he was found to have bilateral common iliac aneursys above repair threshold and would be recommended to treat concurrently. Unfortunately, patient missed his CT angio needed for surgical planning. Again discussed the importance of this and risks associated with not having repair and follow up. Patient advised to contact vascular office.  Current Medication: Outpatient Encounter Medications as of 10/05/2021  Medication Sig   acetaminophen (TYLENOL) 500 MG tablet Take 500 mg by mouth every 6 (six) hours as needed.   amLODipine (NORVASC) 5 MG tablet Take 1 tablet by mouth once daily   levothyroxine (SYNTHROID) 50 MCG tablet Take 1 tablet (50 mcg total) by mouth daily before breakfast.   lisinopril (ZESTRIL) 5 MG tablet Take 1 tablet (5 mg total) by mouth daily.   rosuvastatin (CRESTOR) 5 MG tablet Take 1 tablet (5 mg total) by mouth daily.   [DISCONTINUED] lisinopril (ZESTRIL) 2.5 MG tablet Take 1 tablet (2.5 mg total) by mouth daily.   No facility-administered encounter medications on file as of 10/05/2021.    Surgical History: Past Surgical History:  Procedure Laterality Date    CHOLECYSTECTOMY      Medical History: Past Medical History:  Diagnosis Date   Hypertension    Hypothyroidism    Vertigo     Family History: Family History  Problem Relation Age of Onset   CAD Neg Hx     Social History   Socioeconomic History   Marital status: Married    Spouse name: Not on file   Number of children: Not on file   Years of education: Not on file   Highest education level: Not on file  Occupational History   Not on file  Tobacco Use   Smoking status: Former   Smokeless tobacco: Never   Tobacco comments:    quit >30 years ago  Substance and Sexual Activity   Alcohol use: Yes    Comment: occasionally   Drug use: No    Comment: Quit >40 years ago   Sexual activity: Not on file  Other Topics Concern   Not on file  Social History Narrative   Active and independent at baseline   Social Determinants of Health   Financial Resource Strain: Not on file  Food Insecurity: Not on file  Transportation Needs: Not on file  Physical Activity: Not on file  Stress: Not on file  Social Connections: Not on file  Intimate Partner Violence: Not on file      Review of Systems  Constitutional:  Negative for chills, fatigue and unexpected weight change.  HENT:  Negative for congestion, postnasal drip, rhinorrhea, sneezing and sore throat.  Eyes:  Negative for redness.  Respiratory:  Negative for cough, chest tightness and shortness of breath.   Cardiovascular:  Negative for chest pain and palpitations.  Gastrointestinal:  Negative for abdominal pain, constipation, diarrhea, nausea and vomiting.  Genitourinary:  Negative for dysuria and frequency.  Musculoskeletal:  Negative for back pain, joint swelling and neck pain.  Skin:  Negative for rash.  Neurological: Negative.  Negative for tremors and numbness.  Hematological:  Negative for adenopathy. Does not bruise/bleed easily.  Psychiatric/Behavioral:  Negative for behavioral problems (Depression), sleep  disturbance and suicidal ideas. The patient is not nervous/anxious.     Vital Signs: BP (!) 166/96   Pulse 65   Temp 97.8 F (36.6 C)   Resp 16   Ht '6\' 3"'$  (1.905 m)   Wt 212 lb 6.4 oz (96.3 kg)   SpO2 100%   BMI 26.55 kg/m    Physical Exam Vitals and nursing note reviewed.  Constitutional:      General: He is not in acute distress.    Appearance: He is well-developed. He is not diaphoretic.  HENT:     Head: Normocephalic and atraumatic.     Mouth/Throat:     Pharynx: No oropharyngeal exudate.  Eyes:     Pupils: Pupils are equal, round, and reactive to light.  Neck:     Thyroid: No thyromegaly.     Vascular: [name].     Trachea: No tracheal deviation.  Cardiovascular:     Rate and Rhythm: Normal rate and regular rhythm.     Heart sounds: Normal heart sounds. No murmur heard.    No friction rub. No gallop.  Pulmonary:     Effort: Pulmonary effort is normal. No respiratory distress.     Breath sounds: [name] or rales.  Chest:     Chest wall: No tenderness.  Abdominal:     General: Bowel sounds are normal.     Palpations: Abdomen is soft.  Musculoskeletal:        General: Normal range of motion.     Cervical back: Normal range of motion and neck supple.  Lymphadenopathy:     Cervical: No cervical adenopathy.  Skin:    General: Skin is warm and dry.  Neurological:     Mental Status: He is alert and oriented to person, place, and time.     Cranial Nerves: No cranial nerve deficit.  Psychiatric:        Behavior: Behavior normal.        Thought Content: Thought content normal.        Judgment: Judgment normal.        Assessment/Plan: 1. Essential hypertension Will double lisinopril to '5mg'$  total and continue amlodipine. Will monitor closely and may need further titration - lisinopril (ZESTRIL) 5 MG tablet; Take 1 tablet (5 mg total) by mouth daily.  Dispense: 90 tablet; Refill: 3  2. Infrarenal abdominal aortic aneurysm (AAA) without rupture  (HCC) Advised to follow up with vascular  3. Iliac artery aneurysm (HCC) Advised to follow up with vascular  4. Acquired hypothyroidism Will update labs and adjust synthroid accordingly   General Counseling: Casimiro Needle understanding of the findings of todays visit and agrees with plan of treatment. I have discussed any further diagnostic evaluation that may be needed or ordered today. We also reviewed his medications today. he has been encouraged to call the office with any questions or concerns that should arise related to todays visit.    No orders of  the defined types were placed in this encounter.   Meds ordered this encounter  Medications   lisinopril (ZESTRIL) 5 MG tablet    Sig: Take 1 tablet (5 mg total) by mouth daily.    Dispense:  90 tablet    Refill:  3    This patient was seen by Drema Dallas, PA-C in collaboration with Dr. Clayborn Bigness as a part of collaborative care agreement.   Total time spent:30 Minutes Time spent includes review of chart, medications, test results, and follow up plan with the patient.      Dr Lavera Guise Internal medicine

## 2021-11-02 ENCOUNTER — Ambulatory Visit: Payer: Medicare Other | Admitting: Physician Assistant

## 2021-11-17 ENCOUNTER — Other Ambulatory Visit: Payer: Self-pay | Admitting: Internal Medicine

## 2021-11-17 DIAGNOSIS — I10 Essential (primary) hypertension: Secondary | ICD-10-CM

## 2021-11-26 ENCOUNTER — Telehealth (INDEPENDENT_AMBULATORY_CARE_PROVIDER_SITE_OTHER): Payer: Self-pay | Admitting: Nurse Practitioner

## 2021-11-26 NOTE — Telephone Encounter (Signed)
Spoke to pt about CT that Eulogio Ditch, NP ordered in June. Looks like pt made an appt but was a no show. Patient stated that he is financially unable to complete the CT. He stated that he will call back when he is ready. Nothing further is needed at this time.

## 2021-12-04 ENCOUNTER — Other Ambulatory Visit: Payer: Self-pay | Admitting: Physician Assistant

## 2021-12-04 DIAGNOSIS — I10 Essential (primary) hypertension: Secondary | ICD-10-CM

## 2022-01-08 ENCOUNTER — Other Ambulatory Visit: Payer: Self-pay | Admitting: Physician Assistant

## 2022-01-08 DIAGNOSIS — I700 Atherosclerosis of aorta: Secondary | ICD-10-CM

## 2022-01-08 NOTE — Telephone Encounter (Signed)
Pt need appt for refills  ?

## 2022-02-15 ENCOUNTER — Other Ambulatory Visit: Payer: Self-pay | Admitting: Physician Assistant

## 2022-02-15 DIAGNOSIS — I10 Essential (primary) hypertension: Secondary | ICD-10-CM

## 2022-04-03 ENCOUNTER — Other Ambulatory Visit: Payer: Self-pay | Admitting: Physician Assistant

## 2022-04-03 DIAGNOSIS — I700 Atherosclerosis of aorta: Secondary | ICD-10-CM

## 2022-04-09 ENCOUNTER — Other Ambulatory Visit: Payer: Self-pay | Admitting: Physician Assistant

## 2022-04-09 DIAGNOSIS — I10 Essential (primary) hypertension: Secondary | ICD-10-CM

## 2022-04-18 ENCOUNTER — Other Ambulatory Visit: Payer: Self-pay | Admitting: Physician Assistant

## 2022-04-18 DIAGNOSIS — E039 Hypothyroidism, unspecified: Secondary | ICD-10-CM

## 2022-05-13 ENCOUNTER — Ambulatory Visit (INDEPENDENT_AMBULATORY_CARE_PROVIDER_SITE_OTHER): Payer: Medicare HMO | Admitting: Physician Assistant

## 2022-05-13 ENCOUNTER — Encounter: Payer: Self-pay | Admitting: Physician Assistant

## 2022-05-13 VITALS — BP 144/92 | HR 80 | Temp 98.1°F | Resp 16 | Ht 75.0 in | Wt 209.0 lb

## 2022-05-13 DIAGNOSIS — Z0001 Encounter for general adult medical examination with abnormal findings: Secondary | ICD-10-CM | POA: Diagnosis not present

## 2022-05-13 DIAGNOSIS — R300 Dysuria: Secondary | ICD-10-CM | POA: Diagnosis not present

## 2022-05-13 DIAGNOSIS — E559 Vitamin D deficiency, unspecified: Secondary | ICD-10-CM | POA: Diagnosis not present

## 2022-05-13 DIAGNOSIS — E039 Hypothyroidism, unspecified: Secondary | ICD-10-CM

## 2022-05-13 DIAGNOSIS — I10 Essential (primary) hypertension: Secondary | ICD-10-CM | POA: Diagnosis not present

## 2022-05-13 DIAGNOSIS — R7303 Prediabetes: Secondary | ICD-10-CM

## 2022-05-13 DIAGNOSIS — E538 Deficiency of other specified B group vitamins: Secondary | ICD-10-CM | POA: Diagnosis not present

## 2022-05-13 DIAGNOSIS — R5383 Other fatigue: Secondary | ICD-10-CM | POA: Diagnosis not present

## 2022-05-13 DIAGNOSIS — E782 Mixed hyperlipidemia: Secondary | ICD-10-CM

## 2022-05-13 MED ORDER — LISINOPRIL 5 MG PO TABS: 5.0000 mg | ORAL_TABLET | Freq: Every day | ORAL | 3 refills | Status: DC

## 2022-05-13 NOTE — Progress Notes (Signed)
[facility] [address] [place], [place] [address]  Internal MEDICINE  Office Visit Note  Patient Name: Jeremiah Price  [id number]  [id number]  Date of Service: 05/17/2022  Chief Complaint  Patient presents with   Medicare Wellness   Hypertension     HPI Pt is here for routine health maintenance examination -Has been doing well, but wife has been battling PNA -Bp at home not checked recently. Did bring bottles and has been taking old lisinopril 2.5mg  dose not 5mg  and will fill this now -Discussed missed vascular imaging and follow up and patient states he did not move forward with this and declines again at this time.  -Never had labs done last year, will reorder now -Declines vaccines  Current Medication: Outpatient Encounter Medications as of 05/13/2022  Medication Sig   acetaminophen (TYLENOL) 500 MG tablet Take 500 mg by mouth every 6 (six) hours as needed.   amLODipine (NORVASC) 5 MG tablet Take 1 tablet by mouth once daily   levothyroxine (SYNTHROID) 50 MCG tablet TAKE 1 TABLET BY MOUTH ONCE DAILY BEFORE BREAKFAST   rosuvastatin (CRESTOR) 5 MG tablet Take 1 tablet by mouth once daily   [DISCONTINUED] lisinopril (ZESTRIL) 5 MG tablet Take 1 tablet (5 mg total) by mouth daily.   lisinopril (ZESTRIL) 5 MG tablet Take 1 tablet (5 mg total) by mouth daily.   No facility-administered encounter medications on file as of 05/13/2022.    Surgical History: Past Surgical History:  Procedure Laterality Date   CHOLECYSTECTOMY      Medical History: Past Medical History:  Diagnosis Date   Hypertension    Hypothyroidism    Vertigo     Family History: Family History  Problem Relation Age of Onset   CAD Neg Hx       Review of Systems  Constitutional:  Negative for chills, fatigue and unexpected weight change.  HENT:  Negative for congestion, postnasal drip, rhinorrhea, sneezing and sore throat.   Eyes:  Negative for redness.  Respiratory:  Negative for  cough, chest tightness and shortness of breath.   Cardiovascular:  Negative for chest pain and palpitations.  Gastrointestinal:  Negative for abdominal pain, constipation, diarrhea, nausea and vomiting.  Genitourinary:  Negative for dysuria and frequency.  Musculoskeletal:  Negative for back pain, joint swelling and neck pain.  Skin:  Negative for rash.  Neurological: Negative.  Negative for tremors and numbness.  Hematological:  Negative for adenopathy. Does not bruise/bleed easily.  Psychiatric/Behavioral:  Negative for behavioral problems (Depression), sleep disturbance and suicidal ideas. The patient is not nervous/anxious.      Vital Signs: BP (!) 144/92   Pulse 80   Temp 98.1 F (36.7 C)   Resp 16   Ht 6\' 3"  (1.905 m)   Wt 209 lb (94.8 kg)   SpO2 98%   BMI 26.12 kg/m    Physical Exam Vitals and nursing note reviewed.  Constitutional:      General: He is not in acute distress.    Appearance: He is well-developed. He is not diaphoretic.  HENT:     Head: Normocephalic and atraumatic.     Mouth/Throat:     Pharynx: No oropharyngeal exudate.  Eyes:     Pupils: Pupils are equal, round, and reactive to light.  Neck:     Thyroid: No thyromegaly.     Vascular: [name].     Trachea: No tracheal deviation.  Cardiovascular:     Rate and Rhythm: Normal rate and regular rhythm.  Heart sounds: Normal heart sounds. No murmur heard.    No friction rub. No gallop.  Pulmonary:     Effort: Pulmonary effort is normal. No respiratory distress.     Breath sounds: [name] or rales.  Chest:     Chest wall: No tenderness.  Abdominal:     General: Bowel sounds are normal.     Palpations: Abdomen is soft.     Tenderness: There is no abdominal tenderness.  Musculoskeletal:        General: Normal range of motion.     Cervical back: Normal range of motion and neck supple.  Lymphadenopathy:     Cervical: No cervical adenopathy.  Skin:    General: Skin is warm and dry.   Neurological:     Mental Status: He is alert and oriented to person, place, and time.     Cranial Nerves: No cranial nerve deficit.  Psychiatric:        Behavior: Behavior normal.        Thought Content: Thought content normal.        Judgment: Judgment normal.      LABS: Recent Results (from the past 2160 hour(s))  UA/M w/rflx Culture, Routine     Status: Abnormal   Collection Time: 05/13/22  3:50 PM   Specimen: Urine   Urine  Result Value Ref Range   Specific Gravity, UA 1.023 1.005 - 1.030   pH, UA 5.5 5.0 - 7.5   Color, UA Yellow Yellow   Appearance Ur Clear Clear   Leukocytes,UA Negative Negative   Protein,UA Negative Negative/Trace   Glucose, UA Negative Negative   Ketones, UA Trace (A) Negative   RBC, UA Negative Negative   Bilirubin, UA Negative Negative   Urobilinogen, Ur 1.0 0.2 - 1.0 mg/dL   Nitrite, UA Negative Negative   Microscopic Examination Comment     Comment: Microscopic follows if indicated.   Microscopic Examination See below:     Comment: Microscopic was indicated and was performed.   Urinalysis Reflex Comment     Comment: This specimen will not reflex to a Urine Culture.  Microscopic Examination     Status: None   Collection Time: 05/13/22  3:50 PM   Urine  Result Value Ref Range   WBC, UA None seen 0 - 5 /hpf   RBC, Urine None seen 0 - 2 /hpf   Epithelial Cells (non renal) 0-10 0 - 10 /hpf   Casts None seen None seen /lpf   Bacteria, UA None seen None seen/Few        Assessment/Plan: 1. Encounter for general adult medical examination with abnormal findings CPE performed, will reorder labs, declines vaccines  2. Essential hypertension BP elevated however patient never increased lisinopril to 5mg  as prescribed and will resend now - lisinopril (ZESTRIL) 5 MG tablet; Take 1 tablet (5 mg total) by mouth daily.  Dispense: 90 tablet; Refill: 3  3. Acquired hypothyroidism - TSH + free T4  4. Vitamin D deficiency - VITAMIN D 25 Hydroxy  (Vit-D Deficiency, Fractures)  5. B12 deficiency - B12 and Folate Panel  6. Other fatigue - CBC w/Diff/Platelet - Comprehensive metabolic panel - TSH + free T4 - VITAMIN D 25 Hydroxy (Vit-D Deficiency, Fractures) - B12 and Folate Panel - Lipid Panel With LDL/HDL Ratio - Hgb A1C w/o eAG  7. Mixed hyperlipidemia - Lipid Panel With LDL/HDL Ratio  8. Prediabetes - Hgb A1C w/o eAG  9. Dysuria - UA/M w/rflx Culture, Routine  General Counseling: Alphonsus Sias understanding of the findings of todays visit and agrees with plan of treatment. I have discussed any further diagnostic evaluation that may be needed or ordered today. We also reviewed his medications today. he has been encouraged to call the office with any questions or concerns that should arise related to todays visit.    Counseling:    Orders Placed This Encounter  Procedures   Microscopic Examination   UA/M w/rflx Culture, Routine   CBC w/Diff/Platelet   Comprehensive metabolic panel   TSH + free T4   VITAMIN D 25 Hydroxy (Vit-D Deficiency, Fractures)   B12 and Folate Panel   Lipid Panel With LDL/HDL Ratio   Hgb A1C w/o eAG    Meds ordered this encounter  Medications   lisinopril (ZESTRIL) 5 MG tablet    Sig: Take 1 tablet (5 mg total) by mouth daily.    Dispense:  90 tablet    Refill:  3    This patient was seen by Lynn Ito, PA-C in collaboration with Dr. Beverely Risen as a part of collaborative care agreement.  Total time spent:35 Minutes  Time spent includes review of chart, medications, test results, and follow up plan with the patient.     Lyndon Code, MD  Internal Medicine

## 2022-05-14 LAB — UA/M W/RFLX CULTURE, ROUTINE
Bilirubin, UA: NEGATIVE
Glucose, UA: NEGATIVE
Leukocytes,UA: NEGATIVE
Nitrite, UA: NEGATIVE
Protein,UA: NEGATIVE
RBC, UA: NEGATIVE
Specific Gravity, UA: 1.023 (ref 1.005–1.030)
Urobilinogen, Ur: 1 mg/dL (ref 0.2–1.0)
pH, UA: 5.5 (ref 5.0–7.5)

## 2022-05-14 LAB — MICROSCOPIC EXAMINATION
Bacteria, UA: NONE SEEN
Casts: NONE SEEN /lpf
RBC, Urine: NONE SEEN /hpf (ref 0–2)
WBC, UA: NONE SEEN /hpf (ref 0–5)

## 2022-05-30 ENCOUNTER — Other Ambulatory Visit: Payer: Self-pay | Admitting: Physician Assistant

## 2022-05-30 DIAGNOSIS — I10 Essential (primary) hypertension: Secondary | ICD-10-CM

## 2022-06-10 ENCOUNTER — Ambulatory Visit: Payer: Medicare HMO | Admitting: Physician Assistant

## 2022-07-17 ENCOUNTER — Other Ambulatory Visit: Payer: Self-pay | Admitting: Physician Assistant

## 2022-07-17 DIAGNOSIS — E039 Hypothyroidism, unspecified: Secondary | ICD-10-CM

## 2022-09-02 ENCOUNTER — Other Ambulatory Visit: Payer: Self-pay | Admitting: Physician Assistant

## 2022-09-02 DIAGNOSIS — I10 Essential (primary) hypertension: Secondary | ICD-10-CM

## 2022-10-07 ENCOUNTER — Telehealth: Payer: Self-pay

## 2022-10-07 NOTE — Telephone Encounter (Signed)
Pt called that he is having dizziness and also [facility] talk to pt advised him make him appt tomorrow with alyssa symptoms worse go to ED

## 2022-10-08 ENCOUNTER — Ambulatory Visit: Payer: Medicare HMO | Admitting: Nurse Practitioner

## 2022-10-08 ENCOUNTER — Encounter: Payer: Self-pay | Admitting: Nurse Practitioner

## 2022-10-08 VITALS — BP 146/84 | HR 75 | Temp 98.1°F | Resp 16 | Ht 75.0 in | Wt 208.0 lb

## 2022-10-08 DIAGNOSIS — I10 Essential (primary) hypertension: Secondary | ICD-10-CM

## 2022-10-08 MED ORDER — LISINOPRIL 10 MG PO TABS
10.0000 mg | ORAL_TABLET | Freq: Every day | ORAL | 2 refills | Status: DC
Start: 2022-10-08 — End: 2023-01-30

## 2022-10-08 NOTE — Progress Notes (Signed)
[facility] [address] [place], [place] [address]  Internal MEDICINE  Office Visit Note  Patient Name: Jeremiah Price  [id number]  [id number]  Date of Service: 10/08/2022  Chief Complaint  Patient presents with   Acute Visit    Dizziness/ high bp- No chest pain or sob      HPI Moustapha presents for an acute sick visit for dizziness and high blood pressure High blood pressure since about 9/27 Having dizziness with moving --  Denies any chest pain or SOB  Takes amlodipine 5mg  and lisinopril 5 mg daily.    Current Medication:  Outpatient Encounter Medications as of 10/08/2022  Medication Sig   acetaminophen (TYLENOL) 500 MG tablet Take 500 mg by mouth every 6 (six) hours as needed.   amLODipine (NORVASC) 5 MG tablet Take 1 tablet by mouth once daily   levothyroxine (SYNTHROID) 50 MCG tablet TAKE 1 TABLET BY MOUTH ONCE DAILY BEFORE BREAKFAST   lisinopril (ZESTRIL) 10 MG tablet Take 1 tablet (10 mg total) by mouth daily.   rosuvastatin (CRESTOR) 5 MG tablet Take 1 tablet by mouth once daily   [DISCONTINUED] lisinopril (ZESTRIL) 5 MG tablet Take 1 tablet (5 mg total) by mouth daily.   No facility-administered encounter medications on file as of 10/08/2022.      Medical History: Past Medical History:  Diagnosis Date   Hypertension    Hypothyroidism    Vertigo      Vital Signs: BP (!) 146/84 Comment: 150/100  Pulse 75   Temp 98.1 F (36.7 C)   Resp 16   Ht 6\' 3"  (1.905 m)   Wt 208 lb (94.3 kg)   SpO2 99%   BMI 26.00 kg/m    Review of Systems  Constitutional:  Negative for chills, fatigue and unexpected weight change.  HENT:  Negative for congestion, postnasal drip, rhinorrhea, sneezing and sore throat.   Eyes:  Negative for redness.  Respiratory:  Negative for cough, chest tightness and shortness of breath.   Cardiovascular:  Negative for chest pain and palpitations.  Gastrointestinal:  Negative for abdominal pain, constipation, diarrhea,  nausea and vomiting.  Genitourinary:  Negative for dysuria and frequency.  Musculoskeletal:  Negative for arthralgias, back pain, joint swelling and neck pain.  Skin:  Negative for rash.  Neurological:  Positive for dizziness, light-headedness and headaches. Negative for tremors and numbness.  Hematological:  Negative for adenopathy. Does not bruise/bleed easily.  Psychiatric/Behavioral:  Negative for behavioral problems (Depression), sleep disturbance and suicidal ideas. The patient is not nervous/anxious.     Physical Exam Vitals reviewed.  Constitutional:      General: He is not in acute distress.    Appearance: Normal appearance. He is not ill-appearing.  HENT:     Head: Normocephalic and atraumatic.  Eyes:     Pupils: Pupils are equal, round, and reactive to light.  Cardiovascular:     Rate and Rhythm: Normal rate and regular rhythm.  Pulmonary:     Effort: Pulmonary effort is normal. No respiratory distress.  Neurological:     Mental Status: He is alert and oriented to person, place, and time.  Psychiatric:        Mood and Affect: Mood normal.        Behavior: Behavior normal.      Assessment/Plan: 1. Essential hypertension Lisinopril dose increased, follow up in 4 weeks  - lisinopril (ZESTRIL) 10 MG tablet; Take 1 tablet (10 mg total) by mouth daily.  Dispense: 30 tablet; Refill: 2  General Counseling: Alphonsus Sias understanding of the findings of todays visit and agrees with plan of treatment. I have discussed any further diagnostic evaluation that may be needed or ordered today. We also reviewed his medications today. he has been encouraged to call the office with any questions or concerns that should arise related to todays visit.    Counseling:    No orders of the defined types were placed in this encounter.   Meds ordered this encounter  Medications   lisinopril (ZESTRIL) 10 MG tablet    Sig: Take 1 tablet (10 mg total) by mouth daily.     Dispense:  30 tablet    Refill:  2    Discontinue lisinopril 5 mg and fill new script today.    Return for f/u in 3-4 weeks to eval BP with lauren PCP, lisinopril dose increased to 10 mg. .  [place] Controlled Substance Database was reviewed by me for overdose risk score (ORS)  Time spent:20 Minutes Time spent with patient included reviewing progress notes, labs, imaging studies, and discussing plan for follow up.   This patient was seen by Sallyanne Kuster, FNP-C in collaboration with Dr. Beverely Risen as a part of collaborative care agreement.  Joselynn Amoroso R. Tedd Sias, MSN, FNP-C Internal Medicine

## 2022-10-23 ENCOUNTER — Other Ambulatory Visit: Payer: Self-pay | Admitting: Physician Assistant

## 2022-10-23 DIAGNOSIS — E039 Hypothyroidism, unspecified: Secondary | ICD-10-CM

## 2022-10-28 ENCOUNTER — Telehealth: Payer: Self-pay

## 2022-10-28 NOTE — Telephone Encounter (Signed)
Gave verbal order to [facility] as per lauren to to change manufacture for levothyroxine

## 2022-11-04 ENCOUNTER — Ambulatory Visit (INDEPENDENT_AMBULATORY_CARE_PROVIDER_SITE_OTHER): Payer: Medicare HMO | Admitting: Physician Assistant

## 2022-11-04 ENCOUNTER — Encounter: Payer: Self-pay | Admitting: Physician Assistant

## 2022-11-04 VITALS — BP 138/88 | HR 70 | Temp 98.5°F | Resp 16 | Ht 75.0 in | Wt 209.0 lb

## 2022-11-04 DIAGNOSIS — I10 Essential (primary) hypertension: Secondary | ICD-10-CM | POA: Diagnosis not present

## 2022-11-04 DIAGNOSIS — I700 Atherosclerosis of aorta: Secondary | ICD-10-CM

## 2022-11-04 DIAGNOSIS — E039 Hypothyroidism, unspecified: Secondary | ICD-10-CM | POA: Diagnosis not present

## 2022-11-04 MED ORDER — ROSUVASTATIN CALCIUM 5 MG PO TABS: 5.0000 mg | ORAL_TABLET | Freq: Every day | ORAL | 1 refills | Status: DC

## 2022-11-04 NOTE — Progress Notes (Signed)
[facility] [address] [place], [place] [address]  Internal MEDICINE  Office Visit Note  Patient Name: Jeremiah Price  [id number]  [id number]  Date of Service: 11/04/2022  Chief Complaint  Patient presents with   Follow-up    BP   Hypertension    HPI Pt is here for routine follow up -Needs to have labs done, will do this tomorrow before labs expire -Home BP cuff died, needs a new one -Took both BP meds this morning -Feeling better since last visit -Has not called back to vascular for follow up imaging for AAA and will think about this -thinking about shingles vaccine and PNA vaccines  Current Medication: Outpatient Encounter Medications as of 11/04/2022  Medication Sig   acetaminophen (TYLENOL) 500 MG tablet Take 500 mg by mouth every 6 (six) hours as needed.   amLODipine (NORVASC) 5 MG tablet Take 1 tablet by mouth once daily   levothyroxine (SYNTHROID) 50 MCG tablet TAKE 1 TABLET BY MOUTH ONCE DAILY BEFORE BREAKFAST   lisinopril (ZESTRIL) 10 MG tablet Take 1 tablet (10 mg total) by mouth daily.   [DISCONTINUED] rosuvastatin (CRESTOR) 5 MG tablet Take 1 tablet by mouth once daily   rosuvastatin (CRESTOR) 5 MG tablet Take 1 tablet (5 mg total) by mouth daily.   No facility-administered encounter medications on file as of 11/04/2022.    Surgical History: Past Surgical History:  Procedure Laterality Date   CHOLECYSTECTOMY      Medical History: Past Medical History:  Diagnosis Date   Hypertension    Hypothyroidism    Vertigo     Family History: Family History  Problem Relation Age of Onset   CAD Neg Hx     Social History   Socioeconomic History   Marital status: Married    Spouse name: Not on file   Number of children: Not on file   Years of education: Not on file   Highest education level: Not on file  Occupational History   Not on file  Tobacco Use   Smoking status: Former   Smokeless tobacco: Never   Tobacco comments:    quit  >30 years ago  Substance and Sexual Activity   Alcohol use: Not Currently    Comment: occasionally   Drug use: No    Comment: Quit >40 years ago   Sexual activity: Not on file  Other Topics Concern   Not on file  Social History Narrative   Active and independent at baseline   Social Determinants of Health   Financial Resource Strain: Not on file  Food Insecurity: Not on file  Transportation Needs: Not on file  Physical Activity: Not on file  Stress: Not on file  Social Connections: Not on file  Intimate Partner Violence: Not on file      Review of Systems  Constitutional:  Negative for chills, fatigue and unexpected weight change.  HENT:  Negative for congestion, postnasal drip, rhinorrhea, sneezing and sore throat.   Eyes:  Negative for redness.  Respiratory:  Negative for cough, chest tightness and shortness of breath.   Cardiovascular:  Negative for chest pain and palpitations.  Gastrointestinal:  Negative for abdominal pain, constipation, diarrhea, nausea and vomiting.  Genitourinary:  Negative for dysuria and frequency.  Musculoskeletal:  Negative for back pain, joint swelling and neck pain.  Skin:  Negative for rash.  Neurological: Negative.  Negative for tremors and numbness.  Hematological:  Negative for adenopathy. Does not bruise/bleed easily.  Psychiatric/Behavioral:  Negative for behavioral  problems (Depression), sleep disturbance and suicidal ideas. The patient is not nervous/anxious.     Vital Signs: BP 138/88 Comment: 131/90  Pulse 70   Temp 98.5 F (36.9 C)   Resp 16   Ht 6\' 3"  (1.905 m)   Wt 209 lb (94.8 kg)   SpO2 96%   BMI 26.12 kg/m    Physical Exam Vitals and nursing note reviewed.  Constitutional:      General: He is not in acute distress.    Appearance: He is well-developed. He is not diaphoretic.  HENT:     Head: Normocephalic and atraumatic.     Mouth/Throat:     Pharynx: No oropharyngeal exudate.  Eyes:     Pupils: Pupils are  equal, round, and reactive to light.  Neck:     Thyroid: No thyromegaly.     Vascular: [name].     Trachea: No tracheal deviation.  Cardiovascular:     Rate and Rhythm: Normal rate and regular rhythm.     Heart sounds: Normal heart sounds. No murmur heard.    No friction rub. No gallop.  Pulmonary:     Effort: Pulmonary effort is normal. No respiratory distress.     Breath sounds: [name] or rales.  Chest:     Chest wall: No tenderness.  Abdominal:     General: Bowel sounds are normal.     Palpations: Abdomen is soft.  Musculoskeletal:        General: Normal range of motion.     Cervical back: Normal range of motion and neck supple.  Lymphadenopathy:     Cervical: No cervical adenopathy.  Skin:    General: Skin is warm and dry.  Neurological:     Mental Status: He is alert and oriented to person, place, and time.     Cranial Nerves: No cranial nerve deficit.  Psychiatric:        Behavior: Behavior normal.        Thought Content: Thought content normal.        Judgment: Judgment normal.        Assessment/Plan: 1. Essential hypertension BP improved, will need to cuff for monitoring at home  2. Aortic atherosclerosis (HCC) - rosuvastatin (CRESTOR) 5 MG tablet; Take 1 tablet (5 mg total) by mouth daily.  Dispense: 90 tablet; Refill: 1  3. Acquired hypothyroidism Will get labs done to monitor   General Counseling: Jeremiah Price understanding of the findings of todays visit and agrees with plan of treatment. I have discussed any further diagnostic evaluation that may be needed or ordered today. We also reviewed his medications today. he has been encouraged to call the office with any questions or concerns that should arise related to todays visit.    No orders of the defined types were placed in this encounter.   Meds ordered this encounter  Medications   rosuvastatin (CRESTOR) 5 MG tablet    Sig: Take 1 tablet (5 mg total) by mouth daily.    Dispense:   90 tablet    Refill:  1    This patient was seen by Lynn Ito, PA-C in collaboration with Dr. Beverely Risen as a part of collaborative care agreement.   Total time spent:30 Minutes Time spent includes review of chart, medications, test results, and follow up plan with the patient.      Dr Lyndon Code Internal medicine

## 2022-11-27 ENCOUNTER — Encounter: Payer: Self-pay | Admitting: Nurse Practitioner

## 2022-12-14 ENCOUNTER — Other Ambulatory Visit: Payer: Self-pay | Admitting: Physician Assistant

## 2022-12-14 DIAGNOSIS — I10 Essential (primary) hypertension: Secondary | ICD-10-CM

## 2022-12-25 ENCOUNTER — Telehealth: Payer: Self-pay

## 2022-12-25 NOTE — Telephone Encounter (Signed)
Lm for patient regarding possible Lisinopril recall, need to speak with him if he calls back.

## 2023-01-26 ENCOUNTER — Other Ambulatory Visit: Payer: Self-pay | Admitting: Physician Assistant

## 2023-01-26 DIAGNOSIS — E039 Hypothyroidism, unspecified: Secondary | ICD-10-CM

## 2023-01-27 ENCOUNTER — Other Ambulatory Visit: Payer: Self-pay

## 2023-01-27 DIAGNOSIS — E039 Hypothyroidism, unspecified: Secondary | ICD-10-CM

## 2023-01-27 MED ORDER — LEVOTHYROXINE SODIUM 50 MCG PO TABS
50.0000 ug | ORAL_TABLET | Freq: Every day | ORAL | 0 refills | Status: DC
Start: 2023-01-27 — End: 2023-04-30

## 2023-01-30 ENCOUNTER — Other Ambulatory Visit: Payer: Self-pay | Admitting: Nurse Practitioner

## 2023-01-30 DIAGNOSIS — I10 Essential (primary) hypertension: Secondary | ICD-10-CM

## 2023-02-03 ENCOUNTER — Ambulatory Visit: Payer: Medicare HMO | Admitting: Physician Assistant

## 2023-02-13 ENCOUNTER — Other Ambulatory Visit: Payer: Self-pay

## 2023-02-13 ENCOUNTER — Encounter: Payer: Self-pay | Admitting: *Deleted

## 2023-02-13 ENCOUNTER — Emergency Department: Payer: Medicare HMO

## 2023-02-13 DIAGNOSIS — Z5321 Procedure and treatment not carried out due to patient leaving prior to being seen by health care provider: Secondary | ICD-10-CM | POA: Diagnosis not present

## 2023-02-13 DIAGNOSIS — M1712 Unilateral primary osteoarthritis, left knee: Secondary | ICD-10-CM | POA: Diagnosis not present

## 2023-02-13 DIAGNOSIS — Y990 Civilian activity done for income or pay: Secondary | ICD-10-CM | POA: Diagnosis not present

## 2023-02-13 DIAGNOSIS — M25562 Pain in left knee: Secondary | ICD-10-CM | POA: Insufficient documentation

## 2023-02-13 DIAGNOSIS — W1839XA Other fall on same level, initial encounter: Secondary | ICD-10-CM | POA: Insufficient documentation

## 2023-02-13 DIAGNOSIS — Z743 Need for continuous supervision: Secondary | ICD-10-CM | POA: Diagnosis not present

## 2023-02-13 DIAGNOSIS — I10 Essential (primary) hypertension: Secondary | ICD-10-CM | POA: Diagnosis not present

## 2023-02-13 DIAGNOSIS — W19XXXA Unspecified fall, initial encounter: Secondary | ICD-10-CM | POA: Diagnosis not present

## 2023-02-13 NOTE — ED Provider Triage Note (Signed)
 Emergency Medicine Provider Triage Evaluation Note  Jeremiah Price , a 78 y.o. male  was evaluated in triage.  Pt complains of left knee pain. 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 Did not hit his head, no LOC.  Review of Systems  Positive: Left knee pain Negative:   Physical Exam  There were no vitals taken for this visit. Gen:   Awake, no distress   Resp:  Normal effort  MSK:   Moves extremities without difficulty  Other:    Medical Decision Making  Medically screening exam initiated at 8:42 PM.  Appropriate orders placed.  Justinn Welter was informed that the remainder of the evaluation will be completed by another provider, this initial triage assessment does not replace that evaluation, and the importance of remaining in the ED until their evaluation is complete.     Cleaster Tinnie LABOR, PA-C 02/13/23 2044

## 2023-02-13 NOTE — ED Notes (Signed)
 First nurse note-pt brought in via ems from work.  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  No loc.  No deformity to leg.  No swelling.  VS 165/112, p-98, oxygen sats 98%   pt in wheelchair in lobby   pt alert

## 2023-02-13 NOTE — ED Triage Notes (Signed)
 Pt brought in via ems from work.  Pt was cleaning an office and left knee gave out and pt fell.  No loc.  Pt alert speech clear.

## 2023-02-14 ENCOUNTER — Emergency Department
Admission: EM | Admit: 2023-02-14 | Discharge: 2023-02-14 | Discharge status: Against Medical Advice | Payer: Medicare HMO | Attending: Student | Admitting: Student

## 2023-02-14 ENCOUNTER — Telehealth: Payer: Self-pay

## 2023-02-14 NOTE — Telephone Encounter (Signed)
 Pt wife called that pt fall he at ED she like to know he can leave advised her to stay at ED

## 2023-02-20 ENCOUNTER — Ambulatory Visit (INDEPENDENT_AMBULATORY_CARE_PROVIDER_SITE_OTHER): Payer: Medicare HMO | Admitting: Internal Medicine

## 2023-02-20 ENCOUNTER — Encounter: Payer: Self-pay | Admitting: Internal Medicine

## 2023-02-20 VITALS — BP 148/104 | HR 89 | Temp 98.4°F | Resp 16 | Ht 75.0 in | Wt 209.0 lb

## 2023-02-20 DIAGNOSIS — R55 Syncope and collapse: Secondary | ICD-10-CM | POA: Diagnosis not present

## 2023-02-20 DIAGNOSIS — I10 Essential (primary) hypertension: Secondary | ICD-10-CM

## 2023-02-20 DIAGNOSIS — R296 Repeated falls: Secondary | ICD-10-CM

## 2023-02-20 MED ORDER — MELOXICAM 7.5 MG PO TABS: ORAL_TABLET | ORAL | 3 refills | Status: DC

## 2023-02-20 MED ORDER — AMLODIPINE BESYLATE 5 MG PO TABS
ORAL_TABLET | ORAL | 3 refills | Status: DC
Start: 2023-02-20 — End: 2023-08-29

## 2023-02-20 NOTE — Progress Notes (Signed)
 [facility] [address] [place], [place] [address]  Internal MEDICINE  Office Visit Note  Patient Name: Jeremiah Price  [id number]  [id number]  Date of Service: 03/05/2023  Chief Complaint  Patient presents with   Follow-up   Hypertension   Fall    HPI  Pt is seen today due to concerns of fall at his work Denies any weakness or slurred speech, thinks he has hip pain and knee gives out  According to wife he might have arthritis  BP is slightly elevated    Current Medication: Outpatient Encounter Medications as of 02/20/2023  Medication Sig   acetaminophen (TYLENOL) 500 MG tablet Take 500 mg by mouth every 6 (six) hours as needed.   levothyroxine (SYNTHROID) 50 MCG tablet Take 1 tablet (50 mcg total) by mouth daily before breakfast.   lisinopril (ZESTRIL) 10 MG tablet Take 1 tablet by mouth once daily   meloxicam (MOBIC) 7.5 MG tablet Take one tab po bid   rosuvastatin (CRESTOR) 5 MG tablet Take 1 tablet (5 mg total) by mouth daily.   [DISCONTINUED] amLODipine (NORVASC) 5 MG tablet Take 1 tablet by mouth once daily   amLODipine (NORVASC) 5 MG tablet TAKE ONE TAB PO BID   No facility-administered encounter medications on file as of 02/20/2023.    Surgical History: Past Surgical History:  Procedure Laterality Date   CHOLECYSTECTOMY      Medical History: Past Medical History:  Diagnosis Date   Hypertension    Hypothyroidism    Vertigo     Family History: Family History  Problem Relation Age of Onset   CAD Neg Hx     Social History   Socioeconomic History   Marital status: Married    Spouse name: Not on file   Number of children: Not on file   Years of education: Not on file   Highest education level: Not on file  Occupational History   Not on file  Tobacco Use   Smoking status: Former   Smokeless tobacco: Never   Tobacco comments:    quit >30 years ago  Substance and Sexual Activity   Alcohol use: Yes    Comment: occasionally   Drug  use: No    Comment: Quit >40 years ago   Sexual activity: Not on file  Other Topics Concern   Not on file  Social History Narrative   Active and independent at baseline   Social Drivers of Corporate investment banker Strain: Not on file  Food Insecurity: Not on file  Transportation Needs: Not on file  Physical Activity: Not on file  Stress: Not on file  Social Connections: Not on file  Intimate Partner Violence: Not on file      Review of Systems  Constitutional:  Negative for fatigue and fever.  HENT:  Negative for congestion, mouth sores and postnasal drip.   Respiratory:  Negative for cough.   Cardiovascular:  Negative for chest pain.  Genitourinary:  Negative for flank pain.  Musculoskeletal:  Positive for gait problem.  Neurological:  Positive for weakness.  Psychiatric/Behavioral: Negative.      Vital Signs: BP (!) 148/104 (Patient Position: Standing)   Pulse 89   Temp 98.4 F (36.9 C)   Resp 16   Ht 6\' 3"  (1.905 m)   Wt 209 lb (94.8 kg)   SpO2 98%   BMI 26.12 kg/m    Physical Exam Constitutional:      Appearance: Normal appearance.  HENT:  Head: Normocephalic and atraumatic.     Nose: Nose normal.     Mouth/Throat:     Mouth: Mucous membranes are moist.     Pharynx: No posterior oropharyngeal erythema.  Eyes:     Extraocular Movements: Extraocular movements intact.     Pupils: Pupils are equal, round, and reactive to light.  Cardiovascular:     Pulses: Normal pulses.     Heart sounds: Normal heart sounds.  Pulmonary:     Effort: Pulmonary effort is normal.     Breath sounds: Normal breath sounds.  Neurological:     General: No focal deficit present.     Mental Status: He is alert.  Psychiatric:        Mood and Affect: Mood normal.        Behavior: Behavior normal.        Assessment/Plan: 1. Syncope, unspecified syncope type (Primary) Unclear etiology, more mobility relatd, however will have to see Cardiology for echo and carotid  doppelrs  - EKG 12-Lead - AMB referral to orthopedics  2. Frequent falls Pt needs further evaluation for gait and mobility  - AMB referral to orthopedics  3. Essential hypertension Baseline bp is elevated, will need optimum control ,LVH on EKG seen  - amLODipine (NORVASC) 5 MG tablet; TAKE ONE TAB PO BID  Dispense: 180 tablet; Refill: 3   General Counseling: Takeshi verbalizes understanding of the findings of todays visit and agrees with plan of treatment. I have discussed any further diagnostic evaluation that may be needed or ordered today. We also reviewed his medications today. he has been encouraged to call the office with any questions or concerns that should arise related to todays visit.    Orders Placed This Encounter  Procedures   AMB referral to orthopedics   EKG 12-Lead    Meds ordered this encounter  Medications   meloxicam (MOBIC) 7.5 MG tablet    Sig: Take one tab po bid    Dispense:  60 tablet    Refill:  3   amLODipine (NORVASC) 5 MG tablet    Sig: TAKE ONE TAB PO BID    Dispense:  180 tablet    Refill:  3    Total time spent:45 Minutes Time spent includes review of chart, medications, test results, and follow up plan with the patient.   [place] Controlled Substance Database was reviewed by me.   Dr Lyndon Code Internal medicine

## 2023-02-26 ENCOUNTER — Telehealth: Payer: Self-pay | Admitting: Internal Medicine

## 2023-02-26 NOTE — Telephone Encounter (Signed)
Awaiting 02/20/23 office notes for Orthopedic referral-Jeremiah Price

## 2023-03-06 NOTE — Telephone Encounter (Signed)
 Orthopedic referral sent via [employer] to Dr. Yves Dill w/ [facility].  Notified patent. Gave telephone# [phone]

## 2023-03-13 ENCOUNTER — Telehealth: Payer: Self-pay | Admitting: Internal Medicine

## 2023-03-13 ENCOUNTER — Ambulatory Visit: Payer: Medicare HMO | Admitting: Physician Assistant

## 2023-03-13 NOTE — Telephone Encounter (Signed)
 Per Herbert Seta w/ [facility], referral has been closed due to Patient Declined - Pt does not wish to schedule at this time. Medication has helped to relieve his pain. He will call back if his pain returns.

## 2023-03-19 ENCOUNTER — Other Ambulatory Visit: Payer: Self-pay | Admitting: Internal Medicine

## 2023-03-19 DIAGNOSIS — I10 Essential (primary) hypertension: Secondary | ICD-10-CM

## 2023-04-30 ENCOUNTER — Other Ambulatory Visit: Payer: Self-pay | Admitting: Nurse Practitioner

## 2023-04-30 ENCOUNTER — Other Ambulatory Visit: Payer: Self-pay | Admitting: Physician Assistant

## 2023-04-30 DIAGNOSIS — I700 Atherosclerosis of aorta: Secondary | ICD-10-CM

## 2023-04-30 DIAGNOSIS — I10 Essential (primary) hypertension: Secondary | ICD-10-CM

## 2023-04-30 DIAGNOSIS — E039 Hypothyroidism, unspecified: Secondary | ICD-10-CM

## 2023-05-16 ENCOUNTER — Ambulatory Visit: Payer: Medicare HMO | Admitting: Physician Assistant

## 2023-06-24 ENCOUNTER — Other Ambulatory Visit: Payer: Self-pay | Admitting: Internal Medicine

## 2023-07-17 ENCOUNTER — Ambulatory Visit: Admitting: Physician Assistant

## 2023-07-22 ENCOUNTER — Telehealth: Payer: Self-pay

## 2023-07-22 NOTE — Telephone Encounter (Signed)
 Spoke with pt that need to come  for his appt it if he not show up for his appt we unable to see him further treatment due he already no show 3 appt

## 2023-08-12 ENCOUNTER — Other Ambulatory Visit: Payer: Self-pay | Admitting: Physician Assistant

## 2023-08-12 DIAGNOSIS — I10 Essential (primary) hypertension: Secondary | ICD-10-CM

## 2023-08-12 DIAGNOSIS — I700 Atherosclerosis of aorta: Secondary | ICD-10-CM

## 2023-08-18 ENCOUNTER — Telehealth: Payer: Self-pay

## 2023-08-18 NOTE — Telephone Encounter (Signed)
 Lmom that levothyroxine  had recall please call phar

## 2023-08-22 ENCOUNTER — Telehealth: Payer: Self-pay | Admitting: Physician Assistant

## 2023-08-22 NOTE — Telephone Encounter (Signed)
 Left vm to confirm 08/29/23 appointment-Toni

## 2023-08-29 ENCOUNTER — Ambulatory Visit (INDEPENDENT_AMBULATORY_CARE_PROVIDER_SITE_OTHER): Admitting: Physician Assistant

## 2023-08-29 ENCOUNTER — Encounter: Payer: Self-pay | Admitting: Physician Assistant

## 2023-08-29 VITALS — BP 148/90 | HR 73 | Temp 98.6°F | Resp 16 | Ht 75.0 in | Wt 211.0 lb

## 2023-08-29 DIAGNOSIS — Z0000 Encounter for general adult medical examination without abnormal findings: Secondary | ICD-10-CM | POA: Diagnosis not present

## 2023-08-29 DIAGNOSIS — I700 Atherosclerosis of aorta: Secondary | ICD-10-CM | POA: Diagnosis not present

## 2023-08-29 DIAGNOSIS — Z91148 Patient's other noncompliance with medication regimen for other reason: Secondary | ICD-10-CM | POA: Diagnosis not present

## 2023-08-29 DIAGNOSIS — E039 Hypothyroidism, unspecified: Secondary | ICD-10-CM | POA: Diagnosis not present

## 2023-08-29 DIAGNOSIS — I10 Essential (primary) hypertension: Secondary | ICD-10-CM | POA: Diagnosis not present

## 2023-08-29 MED ORDER — AMLODIPINE BESYLATE 10 MG PO TABS: 10.0000 mg | ORAL_TABLET | Freq: Every day | ORAL | 1 refills | Status: AC

## 2023-08-29 MED ORDER — LEVOTHYROXINE SODIUM 50 MCG PO TABS: 50.0000 ug | ORAL_TABLET | Freq: Every day | ORAL | 1 refills | Status: AC

## 2023-08-29 NOTE — Progress Notes (Signed)
 [facility] [address] [place], [place] [address]  Internal MEDICINE  Office Visit Note  Patient Name: Jeremiah Price  [id number]  [id number]  Date of Service: 08/29/2023  Chief Complaint  Patient presents with   Medicare Wellness   Hypertension    HPI Tilmon presents for an annual well visit Well-appearing 78 y.o. male -unfortunately he has not been here in a long time, he has missed several appts Labs: new lab slip given, pt has not gone for any previously ordered labs in past few years New or worsening pain: will hold meloxicam , had been taking but states he doesn't need it now Other concerns: Not checking BP at home -reviewed meds he brought with him in office--bottle of amlodipine  at 5mg  daily still from 12/2022 and still has ~20 left. Never went up to 2 tab and given amount left doesn't appear he is taking daily/. States if dose increase then would prefer to have 10mg  sent to take only 1 tab -did not bring synthroid ...but states he is taking -he will plan for shingles vaccines     08/29/2023   11:05 AM 05/13/2022   11:04 AM 05/11/2021   10:03 AM  MMSE - Mini Mental State Exam  Orientation to time 5 5 5   Orientation to Place 5 5 5   Registration 3 3 3   Attention/ Calculation 5 5 5   Recall 3 3 3   Language- name 2 objects 2 2 2   Language- repeat 1 1 1   Language- follow 3 step command 3 3 3   Language- read & follow direction 1 1 1   Write a sentence 1 1 1   Copy design 1 1 1   Total score 30 30 30     Functional Status Survey: Is the patient deaf or have difficulty hearing?: No Does the patient have difficulty seeing, even when wearing glasses/contacts?: No Does the patient have difficulty concentrating, remembering, or making decisions?: No Does the patient have difficulty walking or climbing stairs?: No Does the patient have difficulty dressing or bathing?: No Does the patient have difficulty doing errands alone such as visiting a doctor's office or  shopping?: No     10/05/2021    9:19 AM 05/13/2022   11:03 AM 11/04/2022    9:39 AM 02/20/2023   10:44 AM 08/29/2023   11:04 AM  Fall Risk  Falls in the past year? 1 1 0 1 0  Was there an injury with Fall? 0 0  0   Fall Risk Category Calculator 2 1  2    Fall Risk Category (Retired) Moderate          Data saved with a previous flowsheet row definition       08/29/2023   11:04 AM  Depression screen PHQ 2/9  Decreased Interest 0  Down, Depressed, Hopeless 0  PHQ - 2 Score 0        No data to display            Current Medication: Outpatient Encounter Medications as of 08/29/2023  Medication Sig   acetaminophen  (TYLENOL ) 500 MG tablet Take 500 mg by mouth every 6 (six) hours as needed.   amLODipine  (NORVASC ) 10 MG tablet Take 1 tablet (10 mg total) by mouth daily.   lisinopril  (ZESTRIL ) 10 MG tablet TAKE 1 TABLET BY MOUTH ONCE DAILY (NEEDS  APPOINTMENT  FOR  FURTHER  REFILLS)   rosuvastatin  (CRESTOR ) 5 MG tablet Take 1 tablet by mouth once daily   [DISCONTINUED] amLODipine  (NORVASC ) 5 MG tablet TAKE ONE  TAB PO BID   [DISCONTINUED] levothyroxine  (SYNTHROID ) 50 MCG tablet TAKE 1 TABLET BY MOUTH ONCE DAILY BEFORE BREAKFAST   [DISCONTINUED] meloxicam  (MOBIC ) 7.5 MG tablet Take one tab po bid   levothyroxine  (SYNTHROID ) 50 MCG tablet Take 1 tablet (50 mcg total) by mouth daily before breakfast.   No facility-administered encounter medications on file as of 08/29/2023.    Surgical History: Past Surgical History:  Procedure Laterality Date   CHOLECYSTECTOMY      Medical History: Past Medical History:  Diagnosis Date   Hypertension    Hypothyroidism    Vertigo     Family History: Family History  Problem Relation Age of Onset   CAD Neg Hx     Social History   Socioeconomic History   Marital status: Married    Spouse name: Not on file   Number of children: Not on file   Years of education: Not on file   Highest education level: Not on file  Occupational  History   Not on file  Tobacco Use   Smoking status: Former   Smokeless tobacco: Never   Tobacco comments:    quit >30 years ago  Substance and Sexual Activity   Alcohol use: Yes    Comment: occasionally   Drug use: No    Comment: Quit >40 years ago   Sexual activity: Not on file  Other Topics Concern   Not on file  Social History Narrative   Active and independent at baseline   Social Drivers of Corporate investment banker Strain: Not on file  Food Insecurity: Not on file  Transportation Needs: Not on file  Physical Activity: Not on file  Stress: Not on file  Social Connections: Not on file  Intimate Partner Violence: Not on file      Review of Systems  Constitutional:  Negative for chills, fatigue and unexpected weight change.  HENT:  Negative for congestion, postnasal drip, rhinorrhea, sneezing and sore throat.   Eyes:  Negative for redness.  Respiratory:  Negative for cough, chest tightness and shortness of breath.   Cardiovascular:  Negative for chest pain and palpitations.  Gastrointestinal:  Negative for abdominal pain, constipation, diarrhea, nausea and vomiting.  Genitourinary:  Negative for dysuria and frequency.  Musculoskeletal:  Negative for back pain, joint swelling and neck pain.  Skin:  Negative for rash.  Neurological: Negative.  Negative for tremors and numbness.  Hematological:  Negative for adenopathy. Does not bruise/bleed easily.  Psychiatric/Behavioral:  Negative for behavioral problems (Depression), sleep disturbance and suicidal ideas. The patient is not nervous/anxious.     Vital Signs: BP (!) 148/90 Comment: 141/98  Pulse 73   Temp 98.6 F (37 C)   Resp 16   Ht 6' 3 (1.905 m)   Wt 211 lb (95.7 kg)   SpO2 99%   BMI 26.37 kg/m    Physical Exam Vitals and nursing note reviewed.  Constitutional:      General: He is not in acute distress.    Appearance: He is well-developed. He is not diaphoretic.  HENT:     Head: Normocephalic  and atraumatic.  Eyes:     Extraocular Movements: Extraocular movements intact.  Neck:     Thyroid : No thyromegaly.     Vascular: [name].     Trachea: No tracheal deviation.  Cardiovascular:     Rate and Rhythm: Normal rate and regular rhythm.     Heart sounds: Normal heart sounds. No murmur heard.    No  friction rub. No gallop.  Pulmonary:     Effort: Pulmonary effort is normal. No respiratory distress.     Breath sounds: [name] or rales.  Musculoskeletal:        General: Normal range of motion.  Skin:    General: Skin is warm and dry.  Neurological:     Mental Status: He is alert and oriented to person, place, and time.  Psychiatric:        Behavior: Behavior normal.        Thought Content: Thought content normal.        Judgment: Judgment normal.        Assessment/Plan: 1. Encounter for annual wellness exam in Medicare patient (Primary) AWV performed, new lab slip given  2. Acquired hypothyroidism Will check labs and adjust synthroid  as indicated - levothyroxine  (SYNTHROID ) 50 MCG tablet; Take 1 tablet (50 mcg total) by mouth daily before breakfast.  Dispense: 90 tablet; Refill: 1  3. Essential hypertension Will send new script - amLODipine  (NORVASC ) 10 MG tablet; Take 1 tablet (10 mg total) by mouth daily.  Dispense: 90 tablet; Refill: 1  4. Aortic atherosclerosis (HCC) Continue lipitor  5. Noncompliance with medication regimen Emphasized importance of taking medications as prescribed and monitoring BP, as well as coming to all appointments. Advised to bring all meds again next visit    General Counseling: Carlin oakland understanding of the findings of todays visit and agrees with plan of treatment. I have discussed any further diagnostic evaluation that may be needed or ordered today. We also reviewed his medications today. he has been encouraged to call the office with any questions or concerns that should arise related to todays visit.    No  orders of the defined types were placed in this encounter.   Meds ordered this encounter  Medications   levothyroxine  (SYNTHROID ) 50 MCG tablet    Sig: Take 1 tablet (50 mcg total) by mouth daily before breakfast.    Dispense:  90 tablet    Refill:  1   amLODipine  (NORVASC ) 10 MG tablet    Sig: Take 1 tablet (10 mg total) by mouth daily.    Dispense:  90 tablet    Refill:  1    Return in about 4 weeks (around 09/26/2023) for HTN--bring all meds, lab review.   Total time spent:35 Minutes Time spent includes review of chart, medications, test results, and follow up plan with the patient.   [place] Controlled Substance Database was reviewed by me.  This patient was seen by Tinnie Pro, PA-C in collaboration with Dr. Sigrid Bathe as a part of collaborative care agreement.  Tinnie Pro, PA-C Internal medicine

## 2023-09-25 ENCOUNTER — Encounter: Payer: Self-pay | Admitting: Physician Assistant

## 2023-09-25 ENCOUNTER — Ambulatory Visit (INDEPENDENT_AMBULATORY_CARE_PROVIDER_SITE_OTHER): Admitting: Physician Assistant

## 2023-09-25 ENCOUNTER — Other Ambulatory Visit: Payer: Self-pay | Admitting: Physician Assistant

## 2023-09-25 VITALS — BP 110/80 | HR 67 | Temp 98.0°F | Resp 16 | Ht 75.0 in | Wt 208.0 lb

## 2023-09-25 DIAGNOSIS — E039 Hypothyroidism, unspecified: Secondary | ICD-10-CM | POA: Diagnosis not present

## 2023-09-25 DIAGNOSIS — I10 Essential (primary) hypertension: Secondary | ICD-10-CM

## 2023-09-25 NOTE — Progress Notes (Signed)
 [facility] [address] [place], [place] [address]  Internal MEDICINE  Office Visit Note  Patient Name: Jeremiah Price  [id number]  [id number]  Date of Service: 09/25/2023  Chief Complaint  Patient presents with   Follow-up   Hypertension    HPI Pt is here for routine follow up -Went for labs this morning, no results yet -BP improved, taking all meds now -No dizziness, and tolerating BP meds well. Will get new batteries for BP cuff to monitor at home -sleeping well, but short time due to wife's dialysis and they get up at 3am. Discussed getting to bed earlier  Current Medication: Outpatient Encounter Medications as of 09/25/2023  Medication Sig   acetaminophen  (TYLENOL ) 500 MG tablet Take 500 mg by mouth every 6 (six) hours as needed.   amLODipine  (NORVASC ) 10 MG tablet Take 1 tablet (10 mg total) by mouth daily.   levothyroxine  (SYNTHROID ) 50 MCG tablet Take 1 tablet (50 mcg total) by mouth daily before breakfast.   lisinopril  (ZESTRIL ) 10 MG tablet TAKE 1 TABLET BY MOUTH ONCE DAILY (NEEDS  APPOINTMENT  FOR  FURTHER  REFILLS)   rosuvastatin  (CRESTOR ) 5 MG tablet Take 1 tablet by mouth once daily   No facility-administered encounter medications on file as of 09/25/2023.    Surgical History: Past Surgical History:  Procedure Laterality Date   CHOLECYSTECTOMY      Medical History: Past Medical History:  Diagnosis Date   Hypertension    Hypothyroidism    Vertigo     Family History: Family History  Problem Relation Age of Onset   CAD Neg Hx     Social History   Socioeconomic History   Marital status: Married    Spouse name: Not on file   Number of children: Not on file   Years of education: Not on file   Highest education level: Not on file  Occupational History   Not on file  Tobacco Use   Smoking status: Former   Smokeless tobacco: Never   Tobacco comments:    quit >30 years ago  Substance and Sexual Activity   Alcohol use: Yes     Comment: occasionally   Drug use: No    Comment: Quit >40 years ago   Sexual activity: Not on file  Other Topics Concern   Not on file  Social History Narrative   Active and independent at baseline   Social Drivers of Corporate investment banker Strain: Not on file  Food Insecurity: Not on file  Transportation Needs: Not on file  Physical Activity: Not on file  Stress: Not on file  Social Connections: Not on file  Intimate Partner Violence: Not on file      Review of Systems  Constitutional:  Negative for chills, fatigue and unexpected weight change.  HENT:  Negative for congestion, postnasal drip, rhinorrhea, sneezing and sore throat.   Eyes:  Negative for redness.  Respiratory:  Negative for cough, chest tightness and shortness of breath.   Cardiovascular:  Negative for chest pain and palpitations.  Gastrointestinal:  Negative for abdominal pain, constipation, diarrhea, nausea and vomiting.  Genitourinary:  Negative for dysuria and frequency.  Musculoskeletal:  Negative for back pain, joint swelling and neck pain.  Skin:  Negative for rash.  Neurological: Negative.  Negative for dizziness, tremors, syncope, light-headedness, numbness and headaches.  Hematological:  Negative for adenopathy. Does not bruise/bleed easily.  Psychiatric/Behavioral:  Negative for behavioral problems (Depression), sleep disturbance and suicidal ideas. The patient is  not nervous/anxious.     Vital Signs: BP 110/80   Pulse 67   Temp 98 F (36.7 C)   Resp 16   Ht 6' 3 (1.905 m)   Wt 208 lb (94.3 kg)   SpO2 99%   BMI 26.00 kg/m    Physical Exam Vitals and nursing note reviewed.  Constitutional:      General: He is not in acute distress.    Appearance: He is well-developed. He is not diaphoretic.  HENT:     Head: Normocephalic and atraumatic.  Eyes:     Extraocular Movements: Extraocular movements intact.  Neck:     Thyroid : No thyromegaly.     Vascular: [name].     Trachea: No  tracheal deviation.  Cardiovascular:     Rate and Rhythm: Normal rate and regular rhythm.     Heart sounds: Normal heart sounds. No murmur heard.    No friction rub. No gallop.  Pulmonary:     Effort: Pulmonary effort is normal. No respiratory distress.     Breath sounds: [name] or rales.  Musculoskeletal:        General: Normal range of motion.  Skin:    General: Skin is warm and dry.  Neurological:     Mental Status: He is alert and oriented to person, place, and time.  Psychiatric:        Behavior: Behavior normal.        Thought Content: Thought content normal.        Judgment: Judgment normal.        Assessment/Plan: 1. Essential hypertension (Primary) Improved, continue current medications and monitor at home  2. Acquired hypothyroidism Continue synthroid , awaiting lab results and will adjust as indicated   General Counseling: Carlin oakland understanding of the findings of todays visit and agrees with plan of treatment. I have discussed any further diagnostic evaluation that may be needed or ordered today. We also reviewed his medications today. he has been encouraged to call the office with any questions or concerns that should arise related to todays visit.    No orders of the defined types were placed in this encounter.   No orders of the defined types were placed in this encounter.   This patient was seen by Tinnie Pro, PA-C in collaboration with Dr. Sigrid Bathe as a part of collaborative care agreement.   Total time spent:30 Minutes Time spent includes review of chart, medications, test results, and follow up plan with the patient.      Dr Fozia M Khan Internal medicine

## 2023-09-26 LAB — COMPREHENSIVE METABOLIC PANEL WITH GFR
ALT: 13 IU/L (ref 0–44)
AST: 23 IU/L (ref 0–40)
Albumin: 4.4 g/dL (ref 3.8–4.8)
Alkaline Phosphatase: 78 IU/L (ref 47–123)
BUN/Creatinine Ratio: 13 (ref 10–24)
BUN: 14 mg/dL (ref 8–27)
Bilirubin Total: 0.7 mg/dL (ref 0.0–1.2)
CO2: 22 mmol/L (ref 20–29)
Calcium: 10.1 mg/dL (ref 8.6–10.2)
Chloride: 103 mmol/L (ref 96–106)
Creatinine, Ser: 1.08 mg/dL (ref 0.76–1.27)
Globulin, Total: 3.5 g/dL (ref 1.5–4.5)
Glucose: 101 mg/dL — ABNORMAL HIGH (ref 70–99)
Potassium: 5.3 mmol/L — ABNORMAL HIGH (ref 3.5–5.2)
Sodium: 140 mmol/L (ref 134–144)
Total Protein: 7.9 g/dL (ref 6.0–8.5)
eGFR: 71 mL/min/1.73 (ref >59)

## 2023-09-26 LAB — CBC WITH DIFFERENTIAL/PLATELET
Basophils Absolute: 0.1 x10E3/uL (ref 0.0–0.2)
Basos: 1 %
EOS (ABSOLUTE): 0.1 x10E3/uL (ref 0.0–0.4)
Eos: 2 %
Hematocrit: 44.8 % (ref 37.5–51.0)
Hemoglobin: 14.5 g/dL (ref 13.0–17.7)
Immature Grans (Abs): 0 x10E3/uL (ref 0.0–0.1)
Immature Granulocytes: 0 %
Lymphocytes Absolute: 1.7 x10E3/uL (ref 0.7–3.1)
Lymphs: 38 %
MCH: 28 pg (ref 26.6–33.0)
MCHC: 32.4 g/dL (ref 31.5–35.7)
MCV: 87 fL (ref 79–97)
Monocytes Absolute: 0.5 x10E3/uL (ref 0.1–0.9)
Monocytes: 10 %
Neutrophils Absolute: 2.2 x10E3/uL (ref 1.4–7.0)
Neutrophils: 49 %
Platelets: 322 x10E3/uL (ref 150–450)
RBC: 5.18 x10E6/uL (ref 4.14–5.80)
RDW: 13 % (ref 11.6–15.4)
WBC: 4.6 x10E3/uL (ref 3.4–10.8)

## 2023-09-26 LAB — LIPID PANEL WITH LDL/HDL RATIO
Cholesterol, Total: 109 mg/dL (ref 100–199)
HDL: 47 mg/dL (ref >39)
LDL Chol Calc (NIH): 49 mg/dL (ref 0–99)
LDL/HDL Ratio: 1 ratio (ref 0.0–3.6)
Triglycerides: 60 mg/dL (ref 0–149)
VLDL Cholesterol Cal: 13 mg/dL (ref 5–40)

## 2023-09-26 LAB — PSA, TOTAL AND FREE
PSA, Free Pct: 13.6 %
PSA, Free: 1.02 ng/mL
Prostate Specific Ag, Serum: 7.5 ng/mL — ABNORMAL HIGH (ref 0.0–4.0)

## 2023-09-26 LAB — T4, FREE: Free T4: 1.32 ng/dL (ref 0.82–1.77)

## 2023-09-26 LAB — B12 AND FOLATE PANEL
Folate: 13.5 ng/mL (ref >3.0)
Vitamin B-12: 439 pg/mL (ref 232–1245)

## 2023-09-26 LAB — VITAMIN D 25 HYDROXY (VIT D DEFICIENCY, FRACTURES): Vit D, 25-Hydroxy: 40.1 ng/mL (ref 30.0–100.0)

## 2023-09-26 LAB — HGB A1C W/O EAG: Hgb A1c MFr Bld: 6.3 % — ABNORMAL HIGH (ref 4.8–5.6)

## 2023-09-26 LAB — TSH: TSH: 1.02 u[IU]/mL (ref 0.450–4.500)

## 2023-09-29 ENCOUNTER — Ambulatory Visit: Payer: Self-pay | Admitting: Physician Assistant

## 2023-09-29 DIAGNOSIS — E875 Hyperkalemia: Secondary | ICD-10-CM

## 2023-09-30 NOTE — Telephone Encounter (Signed)
LVM for patient regarding lab results.

## 2023-09-30 NOTE — Telephone Encounter (Signed)
-----   Message from Tinnie MARLA Pro sent at 09/29/2023  1:10 PM EDT ----- Please let him know that his PSA was elevated and I would like to refer to urology for further evaluation of his prostate. His A1c is still in prediabetic range. Potassium slightly high and should  stop any potassium supplements. B12 and other labs normal. I am placing order to having potassium rechecked and he should go in 2 weeks. ----- Message ----- From: Rebecka Memos Lab Results In Sent: 09/26/2023   7:16 AM EDT To: Tinnie MARLA Pro, PA-C

## 2023-10-09 ENCOUNTER — Ambulatory Visit (INDEPENDENT_AMBULATORY_CARE_PROVIDER_SITE_OTHER): Admitting: Nurse Practitioner

## 2023-10-09 ENCOUNTER — Encounter: Payer: Self-pay | Admitting: Nurse Practitioner

## 2023-10-09 ENCOUNTER — Telehealth: Payer: Self-pay | Admitting: Nurse Practitioner

## 2023-10-09 ENCOUNTER — Telehealth: Payer: Self-pay | Admitting: Physician Assistant

## 2023-10-09 VITALS — BP 170/110 | HR 85 | Temp 98.2°F | Resp 16 | Ht 75.0 in | Wt 206.4 lb

## 2023-10-09 DIAGNOSIS — G3184 Mild cognitive impairment, so stated: Secondary | ICD-10-CM | POA: Diagnosis not present

## 2023-10-09 DIAGNOSIS — R296 Repeated falls: Secondary | ICD-10-CM

## 2023-10-09 DIAGNOSIS — R2681 Unsteadiness on feet: Secondary | ICD-10-CM | POA: Diagnosis not present

## 2023-10-09 DIAGNOSIS — R2689 Other abnormalities of gait and mobility: Secondary | ICD-10-CM | POA: Diagnosis not present

## 2023-10-09 NOTE — Telephone Encounter (Signed)
 Urgent Neurology referral sent via [facility] to Dr. Lane w/ [facility]. Lvm notifying patient. Gave telephone # [phone]

## 2023-10-09 NOTE — Telephone Encounter (Signed)
 error

## 2023-10-09 NOTE — Progress Notes (Signed)
 [facility] [address] [place], [place] [address]  Internal MEDICINE  Office Visit Note  Patient Name: Jeremiah Price  [id number]  [id number]  Date of Service: 10/09/2023  Chief Complaint  Patient presents with   Acute Visit    Unstable gait 2 falls     HPI Frazer presents for an acute sick visit for unsteady gait, impaired balance and multiple falls. --falling more recently multiple times including yesterday and Monday. He has had issues for several months per his wife.  --reports a gradual increase in unstable gait. Denies dizziness or vertigo --elevated BP today, patient has not taken his BP medication yet. Reminded patient to take his medication.    Current Medication:  Outpatient Encounter Medications as of 10/09/2023  Medication Sig   acetaminophen  (TYLENOL ) 500 MG tablet Take 500 mg by mouth every 6 (six) hours as needed.   amLODipine  (NORVASC ) 10 MG tablet Take 1 tablet (10 mg total) by mouth daily.   levothyroxine  (SYNTHROID ) 50 MCG tablet Take 1 tablet (50 mcg total) by mouth daily before breakfast.   lisinopril  (ZESTRIL ) 10 MG tablet TAKE 1 TABLET BY MOUTH ONCE DAILY (NEEDS  APPOINTMENT  FOR  FURTHER  REFILLS)   rosuvastatin  (CRESTOR ) 5 MG tablet Take 1 tablet by mouth once daily   No facility-administered encounter medications on file as of 10/09/2023.      Medical History: Past Medical History:  Diagnosis Date   Hypertension    Hypothyroidism    Vertigo      Vital Signs: BP (!) 170/110 Comment: 170/100  Pulse 85   Temp 98.2 F (36.8 C)   Resp 16   Ht 6' 3 (1.905 m)   Wt 206 lb 6.4 oz (93.6 kg)   SpO2 96%   BMI 25.80 kg/m    Review of Systems  Constitutional: Negative.  Negative for fatigue.  HENT: Negative.    Respiratory: Negative.  Negative for cough, chest tightness, shortness of breath and wheezing.   Cardiovascular: Negative.  Negative for chest pain and palpitations.  Gastrointestinal: Negative.   Genitourinary:  Negative.   Musculoskeletal:  Positive for gait problem. Negative for arthralgias and back pain.  Neurological:  Negative for dizziness, tremors, seizures, syncope, weakness, light-headedness and headaches.  Psychiatric/Behavioral:  Negative for self-injury and suicidal ideas.     Physical Exam Vitals reviewed.  Constitutional:      General: He is not in acute distress.    Appearance: Normal appearance. He is not ill-appearing.  HENT:     Head: Normocephalic and atraumatic.     Right Ear: Tympanic membrane, ear canal and external ear normal.     Left Ear: Tympanic membrane, ear canal and external ear normal.  Eyes:     Pupils: Pupils are equal, round, and reactive to light.  Cardiovascular:     Rate and Rhythm: Normal rate and regular rhythm.  Pulmonary:     Effort: Pulmonary effort is normal. No respiratory distress.  Musculoskeletal:        General: Normal range of motion.     Right lower leg: [name].     Left lower leg: [name].  Skin:    Capillary Refill: Capillary refill takes less than 2 seconds.  Neurological:     Mental Status: He is alert and oriented to person, place, and time.     Motor: No weakness.     Gait: Gait abnormal.  Psychiatric:        Mood and Affect: Mood normal.  Behavior: Behavior normal.       Assessment/Plan: 1. Unsteady gait when walking (Primary) CT head ordered and referred to neurology - CT HEAD W & WO CONTRAST ( ); Future - Ambulatory referral to Neurology  2. Impairment of balance CT head ordered and referred to neurology - CT HEAD W & WO CONTRAST ( ); Future - Ambulatory referral to Neurology  3. MCI (mild cognitive impairment) with memory loss CT head ordered and referred to neurology - CT HEAD W & WO CONTRAST ( ); Future - Ambulatory referral to Neurology  4. Multiple falls CT head ordered and referred to neurology - CT HEAD W & WO CONTRAST ( ); Future - Ambulatory referral to Neurology   General  Counseling: Carlin oakland understanding of the findings of todays visit and agrees with plan of treatment. I have discussed any further diagnostic evaluation that may be needed or ordered today. We also reviewed his medications today. he has been encouraged to call the office with any questions or concerns that should arise related to todays visit.    Counseling:    Orders Placed This Encounter  Procedures   CT HEAD W & WO CONTRAST ( )   Ambulatory referral to Neurology    No orders of the defined types were placed in this encounter.   Return in about 4 weeks (around 11/06/2023) for F/U, BP check with lauren and also review CT results. .  [place] Controlled Substance Database was reviewed by me for overdose risk score (ORS)  Time spent:30 Minutes Time spent with patient included reviewing progress notes, labs, imaging studies, and discussing plan for follow up.   This patient was seen by Mardy Maxin, FNP-C in collaboration with Dr. Sigrid Bathe as a part of collaborative care agreement.  Apolonio Cutting R. Maxin, MSN, FNP-C Internal Medicine

## 2023-10-10 ENCOUNTER — Telehealth: Payer: Self-pay | Admitting: Nurse Practitioner

## 2023-10-10 ENCOUNTER — Encounter: Payer: Self-pay | Admitting: Nurse Practitioner

## 2023-10-10 NOTE — Telephone Encounter (Signed)
 Neurology appointment 12/09/2023 with Dr. Lane @ [facility]

## 2023-10-14 ENCOUNTER — Ambulatory Visit
Admission: RE | Admit: 2023-10-14 | Discharge: 2023-10-14 | Disposition: A | Discharge status: Home / Self Care | Source: Ambulatory Visit | Attending: Nurse Practitioner | Admitting: Nurse Practitioner

## 2023-10-14 DIAGNOSIS — G3184 Mild cognitive impairment, so stated: Secondary | ICD-10-CM

## 2023-10-14 DIAGNOSIS — R296 Repeated falls: Secondary | ICD-10-CM

## 2023-10-14 DIAGNOSIS — R2689 Other abnormalities of gait and mobility: Secondary | ICD-10-CM

## 2023-10-14 DIAGNOSIS — R220 Localized swelling, mass and lump, head: Secondary | ICD-10-CM | POA: Diagnosis not present

## 2023-10-14 DIAGNOSIS — R2681 Unsteadiness on feet: Secondary | ICD-10-CM

## 2023-10-14 MED ORDER — IOPAMIDOL (ISOVUE-300) INJECTION 61%
75.0000 mL | Freq: Once | INTRAVENOUS | Status: AC | PRN
  Administered 2023-10-14: 75 mL via INTRAVENOUS

## 2023-10-26 ENCOUNTER — Other Ambulatory Visit: Payer: Self-pay | Admitting: Physician Assistant

## 2023-10-26 DIAGNOSIS — I10 Essential (primary) hypertension: Secondary | ICD-10-CM

## 2023-11-06 ENCOUNTER — Other Ambulatory Visit: Payer: Self-pay | Admitting: Physician Assistant

## 2023-11-06 ENCOUNTER — Ambulatory Visit: Admitting: Physician Assistant

## 2023-11-06 DIAGNOSIS — I700 Atherosclerosis of aorta: Secondary | ICD-10-CM

## 2023-11-10 ENCOUNTER — Ambulatory Visit: Admitting: Physician Assistant

## 2023-11-10 ENCOUNTER — Encounter: Payer: Self-pay | Admitting: Physician Assistant

## 2023-11-10 VITALS — BP 137/78 | HR 76 | Temp 98.3°F | Resp 16 | Ht 75.0 in | Wt 206.4 lb

## 2023-11-10 DIAGNOSIS — G9608 Other cranial cerebrospinal fluid leak: Secondary | ICD-10-CM | POA: Diagnosis not present

## 2023-11-10 DIAGNOSIS — R2681 Unsteadiness on feet: Secondary | ICD-10-CM | POA: Diagnosis not present

## 2023-11-10 DIAGNOSIS — G3184 Mild cognitive impairment, so stated: Secondary | ICD-10-CM

## 2023-11-10 DIAGNOSIS — I700 Atherosclerosis of aorta: Secondary | ICD-10-CM | POA: Diagnosis not present

## 2023-11-10 DIAGNOSIS — R2689 Other abnormalities of gait and mobility: Secondary | ICD-10-CM | POA: Diagnosis not present

## 2023-11-10 DIAGNOSIS — H9193 Unspecified hearing loss, bilateral: Secondary | ICD-10-CM

## 2023-11-10 MED ORDER — ROSUVASTATIN CALCIUM 5 MG PO TABS: 5.0000 mg | ORAL_TABLET | Freq: Every day | ORAL | 0 refills | Status: DC

## 2023-11-10 NOTE — Progress Notes (Unsigned)
 [facility] [address] [place], [place] [address]  Internal MEDICINE  Office Visit Note  Patient Name: Jeremiah Price  [id number]  [id number]  Date of Service: 11/11/2023  Chief Complaint  Patient presents with   Follow-up    Review head CT   Hypertension    HPI Pt is here for routine follow up -No recent falls since last visit -states if he drives a distance and has been sitting awhile and then goes to walk can be unsteady. Same thing if sitting for awhile and then feels off when he first stands, but the longer he walks the better it gets -no vision changes or dizziness -states memory worsening when it comes to remembering past events/people. Forgets what he was leaving the room for at home. States he hasn't had trouble getting lost driving anywhere but is afraid this could happen in future -some hearing issues as well and wonders if wax or hearing problem. Bilateral and no sudden changes, but wife is bugging him about having his ears cleaned out -BP doing well on current meds  CT results: IMPRESSION:  1. No acute intracranial abnormality no mass.  2. Slight interval enlargement of small bifrontal subdural hygromas. Only mild  associated mass effect.   -already has appt 12/09/23 at [facility] with Dr. Lane for neurology consult -declines cane or walker to help with balance and safety in meantime. Discussed how this could be helpful to use if needed, but he still declines  Current Medication: Outpatient Encounter Medications as of 11/10/2023  Medication Sig   amLODipine  (NORVASC ) 10 MG tablet Take 1 tablet (10 mg total) by mouth daily.   levothyroxine  (SYNTHROID ) 50 MCG tablet Take 1 tablet (50 mcg total) by mouth daily before breakfast.   lisinopril  (ZESTRIL ) 10 MG tablet TAKE 1 TABLET BY MOUTH ONCE DAILY (NEEDS  APPOINTMENT  FOR  FURTHER  REFILLS)   [DISCONTINUED] acetaminophen  (TYLENOL ) 500 MG tablet Take 500 mg by mouth every 6 (six) hours as needed.    [DISCONTINUED] rosuvastatin  (CRESTOR ) 5 MG tablet TAKE 1 TABLET BY MOUTH ONCE DAILY. NEED APPOINTMENT FOR REFILLS   rosuvastatin  (CRESTOR ) 5 MG tablet Take 1 tablet (5 mg total) by mouth daily.   No facility-administered encounter medications on file as of 11/10/2023.    Surgical History: Past Surgical History:  Procedure Laterality Date   CHOLECYSTECTOMY      Medical History: Past Medical History:  Diagnosis Date   Hypertension    Hypothyroidism    Vertigo     Family History: Family History  Problem Relation Age of Onset   CAD Neg Hx     Social History   Socioeconomic History   Marital status: Married    Spouse name: Not on file   Number of children: Not on file   Years of education: Not on file   Highest education level: Not on file  Occupational History   Not on file  Tobacco Use   Smoking status: Former   Smokeless tobacco: Never   Tobacco comments:    quit >30 years ago  Substance and Sexual Activity   Alcohol use: Not Currently    Comment: occasionally   Drug use: No    Comment: Quit >40 years ago   Sexual activity: Not on file  Other Topics Concern   Not on file  Social History Narrative   Active and independent at baseline   Social Drivers of Corporate Investment Banker Strain: Not on file  Food Insecurity: Not on  file  Transportation Needs: Not on file  Physical Activity: Not on file  Stress: Not on file  Social Connections: Not on file  Intimate Partner Violence: Not on file      Review of Systems  Constitutional: Negative.  Negative for fatigue.  HENT:  Positive for hearing loss.   Eyes:  Negative for visual disturbance.  Respiratory: Negative.  Negative for cough, chest tightness, shortness of breath and wheezing.   Cardiovascular: Negative.  Negative for chest pain and palpitations.  Gastrointestinal: Negative.   Genitourinary: Negative.   Musculoskeletal:  Positive for gait problem. Negative for arthralgias and back pain.   Neurological:  Negative for dizziness, tremors, seizures, syncope, weakness, light-headedness and headaches.  Psychiatric/Behavioral:  Negative for self-injury and suicidal ideas.     Vital Signs: BP 137/78   Pulse 76   Temp 98.3 F (36.8 C)   Resp 16   Ht 6' 3 (1.905 m)   Wt 206 lb 6.4 oz (93.6 kg)   SpO2 98%   BMI 25.80 kg/m    Physical Exam Vitals reviewed.  Constitutional:      General: He is not in acute distress.    Appearance: Normal appearance. He is not ill-appearing.  HENT:     Head: Normocephalic and atraumatic.     Right Ear: Tympanic membrane, ear canal and external ear normal. There is no impacted cerumen.     Left Ear: Tympanic membrane, ear canal and external ear normal. There is no impacted cerumen.  Eyes:     Pupils: Pupils are equal, round, and reactive to light.  Cardiovascular:     Rate and Rhythm: Normal rate and regular rhythm.  Pulmonary:     Effort: Pulmonary effort is normal. No respiratory distress.  Musculoskeletal:        General: Normal range of motion.     Right lower leg: [name].     Left lower leg: [name].  Skin:    Capillary Refill: Capillary refill takes less than 2 seconds.  Neurological:     Mental Status: He is alert and oriented to person, place, and time.     Motor: No weakness.  Psychiatric:        Mood and Affect: Mood normal.        Behavior: Behavior normal.        Assessment/Plan: 1. Subdural hygroma (Primary) Per CT small progression since last imaging in 2018, but has been present for years. Has upcoming neurology consult already scheduled.  2. MCI (mild cognitive impairment) with memory loss Will be seeing neurology   3. Unsteady gait when walking Discussed assistive device such as walker or cane, but pt declines. Discussed safety concerns and importance of utilizing resources for safety and to help prevent falls, but pt still declines. Advised to call if he changes his mind. May need PT pending neurology  eval. Advised to take his time and go slowly to get help get his balance when he first stands as he does better once he is up and going.  4. Impairment of balance Discussed as above  5. Bilateral hearing loss, unspecified hearing loss type - Ambulatory referral to ENT  6. Aortic atherosclerosis - rosuvastatin  (CRESTOR ) 5 MG tablet; Take 1 tablet (5 mg total) by mouth daily.  Dispense: 30 tablet; Refill: 0   General Counseling: Ayinde verbalizes understanding of the findings of todays visit and agrees with plan of treatment. I have discussed any further diagnostic evaluation that may be needed or ordered  today. We also reviewed his medications today. he has been encouraged to call the office with any questions or concerns that should arise related to todays visit.    Orders Placed This Encounter  Procedures   Ambulatory referral to ENT    Meds ordered this encounter  Medications   rosuvastatin  (CRESTOR ) 5 MG tablet    Sig: Take 1 tablet (5 mg total) by mouth daily.    Dispense:  30 tablet    Refill:  0    This patient was seen by Tinnie Pro, PA-C in collaboration with Dr. Sigrid Bathe as a part of collaborative care agreement.   Total time spent:30 Minutes Time spent includes review of chart, medications, test results, and follow up plan with the patient.      Dr Fozia M Khan Internal medicine

## 2023-11-12 ENCOUNTER — Telehealth: Payer: Self-pay | Admitting: Physician Assistant

## 2023-11-12 NOTE — Telephone Encounter (Signed)
 Otolaryngology appointment 11/18/2023 with [facility]

## 2023-11-12 NOTE — Telephone Encounter (Signed)
 Otolaryngology referral sent via [facility] to [facility]. Lvm notifying patient. Gave patient telephone # [phone]

## 2023-11-18 DIAGNOSIS — H903 Sensorineural hearing loss, bilateral: Secondary | ICD-10-CM | POA: Diagnosis not present

## 2023-11-18 DIAGNOSIS — R42 Dizziness and giddiness: Secondary | ICD-10-CM | POA: Diagnosis not present

## 2023-11-24 DIAGNOSIS — Z008 Encounter for other general examination: Secondary | ICD-10-CM | POA: Diagnosis not present

## 2023-12-09 DIAGNOSIS — H9190 Unspecified hearing loss, unspecified ear: Secondary | ICD-10-CM | POA: Diagnosis not present

## 2023-12-09 DIAGNOSIS — R413 Other amnesia: Secondary | ICD-10-CM | POA: Diagnosis not present

## 2023-12-09 DIAGNOSIS — G479 Sleep disorder, unspecified: Secondary | ICD-10-CM | POA: Diagnosis not present

## 2023-12-09 DIAGNOSIS — Z7689 Persons encountering health services in other specified circumstances: Secondary | ICD-10-CM | POA: Diagnosis not present

## 2023-12-09 DIAGNOSIS — G9608 Other cranial cerebrospinal fluid leak: Secondary | ICD-10-CM | POA: Diagnosis not present

## 2023-12-30 ENCOUNTER — Other Ambulatory Visit: Payer: Self-pay | Admitting: Physician Assistant

## 2023-12-30 DIAGNOSIS — I700 Atherosclerosis of aorta: Secondary | ICD-10-CM

## 2024-01-05 ENCOUNTER — Emergency Department

## 2024-01-05 ENCOUNTER — Other Ambulatory Visit: Payer: Self-pay

## 2024-01-05 ENCOUNTER — Emergency Department
Admission: EM | Admit: 2024-01-05 | Discharge: 2024-01-05 | Disposition: A | Discharge status: Home / Self Care | Attending: Emergency Medicine | Admitting: Emergency Medicine

## 2024-01-05 DIAGNOSIS — R4182 Altered mental status, unspecified: Secondary | ICD-10-CM | POA: Diagnosis not present

## 2024-01-05 DIAGNOSIS — I10 Essential (primary) hypertension: Secondary | ICD-10-CM | POA: Diagnosis not present

## 2024-01-05 DIAGNOSIS — Y907 Blood alcohol level of 200-239 mg/100 ml: Secondary | ICD-10-CM | POA: Insufficient documentation

## 2024-01-05 DIAGNOSIS — Z992 Dependence on renal dialysis: Secondary | ICD-10-CM | POA: Insufficient documentation

## 2024-01-05 DIAGNOSIS — Y92009 Unspecified place in unspecified non-institutional (private) residence as the place of occurrence of the external cause: Secondary | ICD-10-CM | POA: Insufficient documentation

## 2024-01-05 DIAGNOSIS — E039 Hypothyroidism, unspecified: Secondary | ICD-10-CM | POA: Diagnosis not present

## 2024-01-05 DIAGNOSIS — W19XXXA Unspecified fall, initial encounter: Secondary | ICD-10-CM | POA: Diagnosis not present

## 2024-01-05 DIAGNOSIS — R4781 Slurred speech: Secondary | ICD-10-CM | POA: Diagnosis present

## 2024-01-05 DIAGNOSIS — F10920 Alcohol use, unspecified with intoxication, uncomplicated: Secondary | ICD-10-CM | POA: Diagnosis not present

## 2024-01-05 LAB — URINALYSIS, ROUTINE W REFLEX MICROSCOPIC
Bilirubin Urine: NEGATIVE
Glucose, UA: NEGATIVE mg/dL
Hgb urine dipstick: NEGATIVE
Ketones, ur: NEGATIVE mg/dL
Leukocytes,Ua: NEGATIVE
Nitrite: NEGATIVE
Protein, ur: NEGATIVE mg/dL
Specific Gravity, Urine: 1.008 (ref 1.005–1.030)
pH: 5 (ref 5.0–8.0)

## 2024-01-05 LAB — CBC WITH DIFFERENTIAL/PLATELET
Abs Immature Granulocytes: 0.01 K/uL (ref 0.00–0.07)
Basophils Absolute: 0 K/uL (ref 0.0–0.1)
Basophils Relative: 1 %
Eosinophils Absolute: 0.1 K/uL (ref 0.0–0.5)
Eosinophils Relative: 2 %
HCT: 38.4 % — ABNORMAL LOW (ref 39.0–52.0)
Hemoglobin: 12.6 g/dL — ABNORMAL LOW (ref 13.0–17.0)
Immature Granulocytes: 0 %
Lymphocytes Relative: 26 %
Lymphs Abs: 1.1 K/uL (ref 0.7–4.0)
MCH: 27.8 pg (ref 26.0–34.0)
MCHC: 32.8 g/dL (ref 30.0–36.0)
MCV: 84.6 fL (ref 80.0–100.0)
Monocytes Absolute: 0.3 K/uL (ref 0.1–1.0)
Monocytes Relative: 7 %
Neutro Abs: 2.8 K/uL (ref 1.7–7.7)
Neutrophils Relative %: 64 %
Platelets: 306 K/uL (ref 150–400)
RBC: 4.54 MIL/uL (ref 4.22–5.81)
RDW: 13.9 % (ref 11.5–15.5)
WBC: 4.3 K/uL (ref 4.0–10.5)
nRBC: 0 % (ref 0.0–0.2)

## 2024-01-05 LAB — URINE DRUG SCREEN
Amphetamines: NEGATIVE
Barbiturates: NEGATIVE
Benzodiazepines: NEGATIVE
Cocaine: NEGATIVE
Fentanyl: NEGATIVE
Methadone Scn, Ur: NEGATIVE
Opiates: NEGATIVE
Tetrahydrocannabinol: NEGATIVE

## 2024-01-05 LAB — COMPREHENSIVE METABOLIC PANEL WITH GFR
ALT: 9 U/L (ref 0–44)
AST: 19 U/L (ref 15–41)
Albumin: 4.3 g/dL (ref 3.5–5.0)
Alkaline Phosphatase: 72 U/L (ref 38–126)
Anion gap: 12 (ref 5–15)
BUN: 13 mg/dL (ref 8–23)
CO2: 26 mmol/L (ref 22–32)
Calcium: 8.9 mg/dL (ref 8.9–10.3)
Chloride: 104 mmol/L (ref 98–111)
Creatinine, Ser: 0.83 mg/dL (ref 0.61–1.24)
GFR, Estimated: >60 mL/min
Glucose, Bld: 115 mg/dL — ABNORMAL HIGH (ref 70–99)
Potassium: 3.6 mmol/L (ref 3.5–5.1)
Sodium: 142 mmol/L (ref 135–145)
Total Bilirubin: 0.3 mg/dL (ref 0.0–1.2)
Total Protein: 7.7 g/dL (ref 6.5–8.1)

## 2024-01-05 LAB — TROPONIN T, HIGH SENSITIVITY: Troponin T High Sensitivity: <15 ng/L (ref 0–19)

## 2024-01-05 LAB — ETHANOL: Alcohol, Ethyl (B): 203 mg/dL — ABNORMAL HIGH

## 2024-01-05 NOTE — ED Notes (Signed)
 Patient transported to CT

## 2024-01-05 NOTE — ED Notes (Signed)
"  Patient back from CT  "

## 2024-01-05 NOTE — ED Triage Notes (Signed)
 Pt BIB [facility] from home following an unwitnessed fall.   Per EMS report pt was found on the floor when his wife got home. Unknown downtime. Wife also verbalized concern for pt who was not acting like himself and having some slurred speech. Pt endorses ETOH. Denies any pain.

## 2024-01-05 NOTE — ED Provider Notes (Signed)
 "  [facility] Provider Note   Event Date/Time   First MD Initiated Contact with Patient 01/05/24 1900     (approximate) History  Fall  HPI Jeremiah Price is a 78 y.o. male with a past medical history of hypertension, vertigo, and hypothyroidism who presents via EMS after being found sleeping on the floor by his wife when she got home from dialysis.  Wife also verbalized to EMS that she is concerned that patient is having some slurred speech and not acting like himself.  Patient states that he did not fall.  Patient states that he went to sleep on the floor by choice as he has been drinking tonight and did not want a fall.  Patient denies any complaints at this time ROS: Patient currently denies any vision changes, tinnitus, difficulty speaking, facial droop, sore throat, chest pain, shortness of breath, abdominal pain, nausea/vomiting/diarrhea, dysuria, or weakness/numbness/paresthesias in any extremity   Physical Exam  Triage Vital Signs: ED Triage Vitals [01/05/24 1906]  Encounter Vitals Group     BP (!) 147/97     Girls Systolic BP Percentile      Girls Diastolic BP Percentile      Boys Systolic BP Percentile      Boys Diastolic BP Percentile      Pulse Rate 79     Resp 16     Temp (!) 97.4 F (36.3 C)     Temp Source Oral     SpO2 100 %     Weight      Height      Head Circumference      Peak Flow      Pain Score 0     Pain Loc      Pain Education      Exclude from Growth Chart    Most recent vital signs: Vitals:   01/05/24 2030 01/05/24 2215  BP: (!) 143/98 127/77  Pulse: 85 84  Resp: 16   Temp:    SpO2: 100% 94%   General: Awake, oriented x4. CV:  Good peripheral perfusion. Resp:  Normal effort. Abd:  No distention. Other:  Elderly well-developed, well-nourished African-American male resting comfortably in no acute distress ED Results / Procedures / Treatments  Labs (all labs ordered are listed, but only abnormal results are  displayed) Labs Reviewed  CBC WITH DIFFERENTIAL/PLATELET - Abnormal; Notable for the following components:      Result Value   Hemoglobin 12.6 (*)    HCT 38.4 (*)    All other components within normal limits  URINALYSIS, ROUTINE W REFLEX MICROSCOPIC - Abnormal; Notable for the following components:   Color, Urine STRAW (*)    APPearance CLEAR (*)    All other components within normal limits  ETHANOL - Abnormal; Notable for the following components:   Alcohol, Ethyl (B) 203 (*)    All other components within normal limits  COMPREHENSIVE METABOLIC PANEL WITH GFR - Abnormal; Notable for the following components:   Glucose, Bld 115 (*)    All other components within normal limits  URINE DRUG SCREEN  TROPONIN T, HIGH SENSITIVITY  TROPONIN T, HIGH SENSITIVITY   EKG ED ECG REPORT I, Artist MARLA Kerns, the attending physician, personally viewed and interpreted this ECG. Date: 01/05/2024 EKG Time: 1908 Rate: 77 Rhythm: normal sinus rhythm QRS Axis: normal Intervals: normal ST/T Wave abnormalities: normal Narrative Interpretation: no evidence of acute ischemia RADIOLOGY ED MD interpretation: CT of the head without contrast interpreted by me shows  no evidence of acute abnormalities including no intracerebral hemorrhage, obvious masses, or significant edema - All radiology independently interpreted and agree with radiology assessment Official radiology report(s): CT Head Wo Contrast Result Date: 01/05/2024 EXAM: CT HEAD WITHOUT CONTRAST 01/05/2024 08:09:42 PM TECHNIQUE: CT of the head was performed without the administration of intravenous contrast. Automated exposure control, iterative reconstruction, and/or weight based adjustment of the mA/kV was utilized to reduce the radiation dose to as low as reasonably achievable. COMPARISON: 10/14/2023 CLINICAL HISTORY: Mental status change, unknown cause. FINDINGS: BRAIN AND VENTRICLES: No acute hemorrhage. No evidence of acute infarct. Diffuse  cerebral atrophy. Unchanged bilateral subdural hygromas with unchanged mild mass effect on the underlying brain. No hydrocephalus. No midline shift. ORBITS: No acute abnormality. SINUSES: No acute abnormality. SOFT TISSUES AND SKULL: No acute soft tissue abnormality. No skull fracture. IMPRESSION: 1. No acute intracranial abnormality. 2. 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 Electronically signed by: Franky Stanford MD 01/05/2024 08:47 PM EST RP Workstation: [id number]   PROCEDURES: Critical Care performed: No Procedures MEDICATIONS ORDERED IN ED: Medications - No data to display IMPRESSION / MDM / ASSESSMENT AND PLAN / ED COURSE  I reviewed the triage vital signs and the nursing notes.                             The patient is on the cardiac monitor to evaluate for evidence of arrhythmia and/or significant heart rate changes. Patient's presentation is most consistent with acute presentation with potential threat to life or bodily function. Patient is a 78 year old male with the above-stated past medical history who presents via EMS after patient's wife found him asleep on the floor.  Patient is in no distress, alert, oriented x 4, and with no complaints at this time DDx: Alcohol intoxication, arrhythmia, orthostatic syncope, vasovagal syncope, intracranial hemorrhage Plan: CBC, CMP, UA, troponin, ethanol, UDS, head CT, EKG  Patient's laboratory and radiologic evaluation negative except for ethanol of 203.  Patient is p.o. tolerant and ambulatory without difficulty.  Patient has a safe ride home with his niece and agrees with plan for discharge at this time with outpatient follow-up as needed.  Patient given return precautions and all questions answered prior to discharge  Dispo: Discharge home with PCP follow-up as needed   FINAL CLINICAL IMPRESSION(S) / ED DIAGNOSES   Final diagnoses:  Fall in home, initial  encounter  Alcoholic intoxication without complication   Rx / DC Orders   ED Discharge Orders     None      Note:  This document was prepared using Dragon voice recognition software and may include unintentional dictation errors.   Jossie Artist POUR, MD 01/05/24 2236  "

## 2024-01-07 ENCOUNTER — Telehealth: Payer: Self-pay | Admitting: Physician Assistant

## 2024-01-07 NOTE — Telephone Encounter (Signed)
 Lvm to schedule ED follow up-Toni

## 2024-01-09 ENCOUNTER — Telehealth: Payer: Self-pay | Admitting: Physician Assistant

## 2024-01-09 NOTE — Telephone Encounter (Signed)
Left 2nd vm to schedule ED follow up-Toni

## 2024-01-19 ENCOUNTER — Ambulatory Visit: Attending: Neurology

## 2024-01-19 DIAGNOSIS — R269 Unspecified abnormalities of gait and mobility: Secondary | ICD-10-CM | POA: Insufficient documentation

## 2024-01-19 DIAGNOSIS — M6281 Muscle weakness (generalized): Secondary | ICD-10-CM | POA: Insufficient documentation

## 2024-01-19 DIAGNOSIS — R2689 Other abnormalities of gait and mobility: Secondary | ICD-10-CM | POA: Insufficient documentation

## 2024-01-20 ENCOUNTER — Ambulatory Visit: Admitting: Physical Therapy

## 2024-01-20 ENCOUNTER — Other Ambulatory Visit: Payer: Self-pay

## 2024-01-20 DIAGNOSIS — M6281 Muscle weakness (generalized): Secondary | ICD-10-CM | POA: Diagnosis present

## 2024-01-20 DIAGNOSIS — R2689 Other abnormalities of gait and mobility: Secondary | ICD-10-CM

## 2024-01-20 DIAGNOSIS — R269 Unspecified abnormalities of gait and mobility: Secondary | ICD-10-CM

## 2024-01-20 NOTE — Therapy (Unsigned)
 " OUTPATIENT PHYSICAL THERAPY NEURO EVALUATION   Patient Name: Jeremiah Price MRN: [id number] DOB:04-Jan-1946, 79 y.o., male Today's Date: 01/20/2024   PCP: Kristina Tinnie POUR, PA-C  REFERRING PROVIDER:  Lane Arthea BRAVO, MD      END OF SESSION:  PT End of Session - 01/20/24 1017     Visit Number 1    Number of Visits 24    Date for Recertification  04/13/24    PT Start Time 1018    PT Stop Time 1100    PT Time Calculation (min) 42 min    Equipment Utilized During Treatment Gait belt    Activity Tolerance Patient tolerated treatment well    Behavior During Therapy WFL for tasks assessed/performed          Past Medical History:  Diagnosis Date   Hypertension    Hypothyroidism    Vertigo    Past Surgical History:  Procedure Laterality Date   CHOLECYSTECTOMY     Patient Active Problem List   Diagnosis Date Noted   Dysuria 12/26/2019   Encounter for general adult medical examination with abnormal findings 12/15/2018   Ventral hernia without obstruction or gangrene 12/15/2018   Need for vaccination against Streptococcus pneumoniae using pneumococcal conjugate vaccine 13 12/15/2018   Screening for colon cancer 01/14/2018   Elevated LFTs 03/03/2016   E coli bacteremia 03/01/2016   Abdominal pain 02/17/2016   Transaminitis 02/17/2016   Hypothyroidism 02/17/2016   Essential hypertension 02/17/2016   Sepsis (HCC) 02/17/2016    ONSET DATE: 6 months- 1 year  REFERRING DIAG:  Diagnosis  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  R26.81 (ICD-10-CM) - Unsteady gait when walking  R29.6 (ICD-10-CM) - Multiple falls  R26.89 (ICD-10-CM) - Impairment of balance    THERAPY DIAG:  Imbalance  Abnormality of gait  Muscle weakness (generalized)  Rationale for Evaluation and Treatment: Rehabilitation  SUBJECTIVE:                                                                                                                                                                                              SUBJECTIVE STATEMENT: States that over the 6 months to a year, he has noticed more unsteadies with his walking, reports that it is the worst when he first stands up after sitting for a little while. Also states that he feels a little lightheaded when he stands up for the first time after prolonged  sitting. Also states that his Left had is not as coordinated as it used to be.  Pt accompanied by: self  PERTINENT HISTORY:   Pertinent hx of  HTN, Hypothyroidism, Vertigo. Pt has had recent hospitalization after being found on floor by wife. States that he had been drinking and laid down on the floor so that he would not fall, and then fell asleep. No significant issues noted upon examination at ED.   PAIN:  Are you having pain? No  PRECAUTIONS: Fall  RED FLAGS: None   WEIGHT BEARING RESTRICTIONS: No  FALLS: Has patient fallen in last 6 months? Yes. Number of falls 4  LIVING ENVIRONMENT: Lives with: lives with their spouse Lives in: House/apartment Stairs: Yes: External: 1 steps; on right going up Has following equipment at home: None  PLOF: Independent, Independent with basic ADLs, Independent with household mobility without device, Independent with community mobility without device, Independent with gait, and Independent with transfers  PATIENT GOALS: improve safety with walking.   OBJECTIVE:  Note: Objective measures were completed at Evaluation unless otherwise noted.  DIAGNOSTIC FINDINGS:   CT 12/2023 IMPRESSION: 1. No acute intracranial abnormality. 2. 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  CT 10/2023  IMPRESSION: 1. No acute intracranial abnormality no mass. 2. Slight interval enlargement of small bifrontal subdural hygromas. Only mild associated mass effect.  COGNITION: Overall cognitive status: History of cognitive  impairments - at baseline   SENSATION: WFL  COORDINATION: Finger to nose WFL Ankle to knee WFL   POSTURE: rounded shoulders and forward head  LOWER EXTREMITY ROM:     WFL for all tested.   LOWER EXTREMITY MMT:    MMT Right Eval Left Eval  Hip flexion 4 4  Hip extension 4 4  Hip abduction 4 4  Hip adduction 5 5  Hip internal rotation    Hip external rotation    Knee flexion 4+ 4+  Knee extension 5 5  Ankle dorsiflexion 5 4+  Ankle plantarflexion    Ankle inversion    Ankle eversion    (Blank rows = not tested)  BED MOBILITY:  Not tested  TRANSFERS: Sit to stand: Modified independence  Assistive device utilized: arm rest     Stand to sit: Modified independence  Assistive device utilized: arm rest      Chair to chair: Modified independence  Assistive device utilized: arm rest        RAMP:  Not tested  CURB:  Findings: UE supported on rail   STAIRS: Findings: Level of Assistance: Modified independence, Stair Negotiation Technique: Step to Pattern Alternating Pattern  with Bilateral Rails, Number of Stairs: 4, Height of Stairs: 6   , and Comments: step to on descent  GAIT: Findings: Gait Characteristics: step through pattern, trendelenburg, lateral hip instability, and narrow BOS, Distance walked: 60, Assistive device utilized:None, Level of assistance: Complete Independence, and Comments: mild R side trendelenburg. And decreased step length on the R side    FUNCTIONAL TESTS:  5 times sit to stand: 17.65 with BUE support  Timed up and go (TUG): 12.41 6 minute walk test: to be completed   10 Meter Walk Test: Patient instructed to walk 10 meters (32.8 ft) as quickly and as safely as possible at their normal speed x2 and at a fast speed x2. Time measured from 2 meter mark to 8 meter mark to accommodate ramp-up and ramp-down.  Normal speed 1: 14.9 sec  Normal speed 2: 14.5 sec  Average Normal speed: 0.68 m/s Fast speed 1: 10.9sec Fast speed 2: 10.8 sec   Average Fast speed: 0.92 m/s Cut off scores: <  0.4 m/s = household Ambulator, 0.4-0.8 m/s = limited community Ambulator, >0.8 m/s = community Ambulator, >1.2 m/s = crossing a street, <1.0 = increased fall risk MCID 0.05 m/s (small), 0.13 m/s (moderate), 0.06 m/s (significant)  (ANPTA Core Set of Outcome Measures for Adults with Neurologic Conditions, 2018)  Berg Balance Scale:    [facility] PT Assessment - 01/20/24 0001       Berg Balance Test   Sit to Stand Able to stand  independently using hands    Standing Unsupported Able to stand safely 2 minutes    Sitting with Back Unsupported but Feet Supported on Floor or Stool Able to sit safely and securely 2 minutes    Stand to Sit Sits safely with minimal use of hands    Transfers Able to transfer safely, definite need of hands    Standing Unsupported with Eyes Closed Able to stand 10 seconds safely    Standing Unsupported with Feet Together Able to place feet together independently and stand 1 minute safely    From Standing, Reach Forward with Outstretched Arm Can reach confidently >25 cm (10)    From Standing Position, Pick up Object from Floor Able to pick up shoe, needs supervision    From Standing Position, Turn to Look Behind Over each Shoulder Looks behind one side only/other side shows less weight shift    Turn 360 Degrees Able to turn 360 degrees safely in 4 seconds or less    Standing Unsupported, Alternately Place Feet on Step/Stool Able to stand independently and safely and complete 8 steps in 20 seconds    Standing Unsupported, One Foot in Front Able to plae foot ahead of the other independently and hold 30 seconds    Standing on One Leg Tries to lift leg/unable to hold 3 seconds but remains standing independently    Total Score 48          Functional gait assessment:   TBD Interpretation of scores: Non-Specific Older Adults Cutoff Score: <=22/30 = risk of falls Parkinsons Disease Cutoff score <15/30= fall risk (Hoehn &  Yahr 1-4)  Minimally Clinically Important Difference (MCID)  Stroke (acute, subacute, and chronic) = MDC: 4.2 points Vestibular (acute) = MDC: 6 points Community Dwelling Older Adults =  MCID: 4 points Parkinsons Disease  =  MDC: 4.3 points  (Academy of Neurologic Physical Therapy (nd). Functional Gait Assessment. Retrieved from https://www.neuropt.org/docs/default-source/cpgs/core-outcome-measures/function-gait-assessment-pocket-guide-proof9-(2).pdf?sfvrsn=b62f35043_0.)  PATIENT SURVEYS:  Hx of memor                                                                                                                             TREATMENT DATE:  PT Evaluation  VS assessment  Sitting 146/94 HR 62  Standing: 140/98 HR 70 no S/s  HEP initiated see below.   PATIENT EDUCATION: Education details: POC Person educated: Patient Education method: Explanation, Facilities Manager, and Handouts Education comprehension: verbalized understanding and returned demonstration  HOME EXERCISE PROGRAM: Access Code: [email or web address] URL: [email or web address] Date:  01/20/2024 Prepared by: Massie Dollar  Exercises - Standing Tandem Balance with Counter Support  - 1 x daily - 7 x weekly - 3 sets - 6 reps - 20secnods  hold - Standing Single Leg Stance with Counter Support  - 1 x daily - 7 x weekly - 3 sets - 6 reps - 20 seconds  hold - Sit to Stand with Armchair  - 1 x daily - 7 x weekly - 3 sets - 10 reps - Standing Hip Abduction with Counter Support  - 1 x daily - 7 x weekly - 3 sets - 10 reps  GOALS: Goals reviewed with patient? Yes   SHORT TERM GOALS: Target date: 02/18/2024    Patient will be independent in home exercise program to improve strength/mobility for better functional independence with ADLs. Baseline:initiated on 1/13 Goal status: INITIAL   LONG TERM GOALS: Target date: 04/14/2024    Patient will increase ABC scale score >80% to demonstrate better functional mobility and better  confidence with ADLs.  Baseline: to be completed  Goal status: INITIAL  2.  Patient (> 64 years old) will complete five times sit to stand test in < 15 seconds indicating an increased LE strength and improved balance. Baseline: 17.65 with BUE support; unable to stand without UE  Goal status: INITIAL  3.  Patient will increase Berg Balance score by > 6 points to demonstrate decreased fall risk during functional activities Baseline: 48 Goal status: INITIAL  4.  Patient will increase 10 meter walk test to >1.40m/s as to improve gait speed for better community ambulation and to reduce fall risk. Baseline: normal 0.68 m/s; fast 0.13m/s Goal status: INITIAL  5.  Patient will reduce timed up and go to <11 seconds to reduce fall risk and demonstrate improved transfer/gait ability. Baseline: 12.41sec  Goal status: INITIAL  6.  Patient will increaseFGA score to >24 as to demonstrate reduced fall risk and improved dynamic gait balance for better safety with community/home ambulation.   Baseline: to be completed  Goal status: INITIAL   ASSESSMENT:  CLINICAL IMPRESSION: Patient is a 79 y.o. male who was seen today for physical therapy evaluation and treatment for imbalance and recent falls. Pt reports that he has been feeling more unsteady with walking over the last year. Noted to have increased fall risk with normal gait speed <0.48m/s, berg 48/56, TUG >11 sec, and 5x STS >15 sec. Strengthen and power deficits also noted with inability to stand without use of UE support from standard seat height. Pt will benefit from skilled PT to address strength, balance and fall risk concerns to improve overall QoL and allow return to PLOF.     OBJECTIVE IMPAIRMENTS: Abnormal gait, decreased activity tolerance, decreased balance, decreased coordination, decreased endurance, decreased knowledge of condition, decreased mobility, difficulty walking, decreased strength, decreased safety awareness, dizziness, impaired  flexibility, and improper body mechanics.   ACTIVITY LIMITATIONS: carrying, lifting, bending, standing, squatting, stairs, transfers, locomotion level, and caring for others  PARTICIPATION LIMITATIONS: meal prep, cleaning, laundry, driving, shopping, community activity, and yard work  PERSONAL FACTORS: Age, Fitness, Past/current experiences, Time since onset of injury/illness/exacerbation, and 1-2 comorbidities:   are also affecting patient's functional outcome.   REHAB POTENTIAL: Good  CLINICAL DECISION MAKING: Evolving/moderate complexity  EVALUATION COMPLEXITY: Moderate  PLAN:  PT FREQUENCY: 1-2x/week  PT DURATION: 12 weeks  PLANNED INTERVENTIONS: 97164- PT Re-evaluation, 97750- Physical Performance Testing, 97110-Therapeutic exercises, 97530- Therapeutic activity, [id number]- Neuromuscular re-education, 97535- Self Care, [id number]- Manual therapy, [id number]- Gait training,  04007- Canalith repositioning, 79439 (1-2 muscles), 20561 (3+ muscles)- Dry Needling, Patient/Family education, Balance training, Stair training, Vestibular training, Visual/preceptual remediation/compensation, DME instructions, Moist heat, and Biofeedback  PLAN FOR NEXT SESSION:  Completed FGA and ABC Expand balance HEP.   Massie FORBES Dollar, PT 01/20/2024, 10:18 AM        "

## 2024-01-22 ENCOUNTER — Ambulatory Visit

## 2024-01-26 ENCOUNTER — Ambulatory Visit

## 2024-01-26 ENCOUNTER — Telehealth: Payer: Self-pay

## 2024-01-26 ENCOUNTER — Other Ambulatory Visit: Payer: Self-pay

## 2024-01-26 DIAGNOSIS — I10 Essential (primary) hypertension: Secondary | ICD-10-CM

## 2024-01-26 MED ORDER — LISINOPRIL 10 MG PO TABS: 10.0000 mg | ORAL_TABLET | Freq: Every day | ORAL | 0 refills | Status: AC

## 2024-01-26 NOTE — Telephone Encounter (Signed)
 Lmom that pt need appt for further refills

## 2024-01-26 NOTE — Telephone Encounter (Signed)
 Patient called due to no show. Left voicemail of time and date of next appointment.   Precious Bard, PT, DPT Physical Therapist - [place] [facility]  Outpatient Physical Therapy- Main Campus [phone]

## 2024-01-26 NOTE — Therapy (Incomplete)
 " OUTPATIENT PHYSICAL THERAPY NEURO TREATMENT   Patient Name: Jeremiah Price MRN: [id number] DOB:1945/10/13, 79 y.o., male Today's Date: 01/26/2024   PCP: Kristina Tinnie POUR, PA-C  REFERRING PROVIDER:  Lane Arthea BRAVO, MD      END OF SESSION:    Past Medical History:  Diagnosis Date   Hypertension    Hypothyroidism    Vertigo    Past Surgical History:  Procedure Laterality Date   CHOLECYSTECTOMY     Patient Active Problem List   Diagnosis Date Noted   Dysuria 12/26/2019   Encounter for general adult medical examination with abnormal findings 12/15/2018   Ventral hernia without obstruction or gangrene 12/15/2018   Need for vaccination against Streptococcus pneumoniae using pneumococcal conjugate vaccine 13 12/15/2018   Screening for colon cancer 01/14/2018   Elevated LFTs 03/03/2016   E coli bacteremia 03/01/2016   Abdominal pain 02/17/2016   Transaminitis 02/17/2016   Hypothyroidism 02/17/2016   Essential hypertension 02/17/2016   Sepsis (HCC) 02/17/2016    ONSET DATE: 6 months- 1 year  REFERRING DIAG:  Diagnosis  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  Rationale for Evaluation and Treatment: Rehabilitation  SUBJECTIVE:                                                                                                                                                                                             SUBJECTIVE STATEMENT: *** Pt accompanied by: self  PERTINENT HISTORY:   Pertinent hx of HTN, Hypothyroidism, Vertigo. Pt has had recent hospitalization after being found on floor by wife. States that he had been drinking and laid down on the floor so that he would not fall, and then fell asleep. No significant issues noted upon examination at ED.    PAIN:  Are you having pain? No  PRECAUTIONS: Fall  RED FLAGS: None   WEIGHT BEARING RESTRICTIONS: No  FALLS: Has patient fallen in last 6 months? Yes. Number of falls 4  LIVING ENVIRONMENT: Lives with: lives with their spouse Lives in: House/apartment Stairs: Yes: External: 1 steps; on right going up Has following equipment at home: None  PLOF: Independent, Independent with basic ADLs, Independent with household mobility without device, Independent with community mobility without device, Independent with gait, and Independent with transfers  PATIENT GOALS: improve safety with walking.   OBJECTIVE:  Note: Objective measures were completed at Evaluation unless otherwise noted.  DIAGNOSTIC FINDINGS:   CT 12/2023  IMPRESSION: 1. No acute intracranial abnormality. 2. 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  CT 10/2023  IMPRESSION: 1. No acute intracranial abnormality no mass. 2. Slight interval enlargement of small bifrontal subdural hygromas. Only mild associated mass effect.  COGNITION: Overall cognitive status: History of cognitive impairments - at baseline   SENSATION: WFL  COORDINATION: Finger to nose WFL Ankle to knee WFL   POSTURE: rounded shoulders and forward head  LOWER EXTREMITY ROM:     WFL for all tested.   LOWER EXTREMITY MMT:    MMT Right Eval Left Eval  Hip flexion 4 4  Hip extension 4 4  Hip abduction 4 4  Hip adduction 5 5  Hip internal rotation    Hip external rotation    Knee flexion 4+ 4+  Knee extension 5 5  Ankle dorsiflexion 5 4+  Ankle plantarflexion    Ankle inversion    Ankle eversion    (Blank rows = not tested)  BED MOBILITY:  Not tested  TRANSFERS: Sit to stand: Modified independence  Assistive device utilized: arm rest     Stand to sit: Modified independence  Assistive device utilized: arm rest      Chair to chair: Modified  independence  Assistive device utilized: arm rest        RAMP:  Not tested  CURB:  Findings: UE supported on rail   STAIRS: Findings: Level of Assistance: Modified independence, Stair Negotiation Technique: Step to Pattern Alternating Pattern  with Bilateral Rails, Number of Stairs: 4, Height of Stairs: 6   , and Comments: step to on descent  GAIT: Findings: Gait Characteristics: step through pattern, trendelenburg, lateral hip instability, and narrow BOS, Distance walked: 60, Assistive device utilized:None, Level of assistance: Complete Independence, and Comments: mild R side trendelenburg. And decreased step length on the R side    FUNCTIONAL TESTS:  5 times sit to stand: 17.65 with BUE support  Timed up and go (TUG): 12.41 6 minute walk test: to be completed   10 Meter Walk Test: Patient instructed to walk 10 meters (32.8 ft) as quickly and as safely as possible at their normal speed x2 and at a fast speed x2. Time measured from 2 meter mark to 8 meter mark to accommodate ramp-up and ramp-down.  Normal speed 1: 14.9 sec  Normal speed 2: 14.5 sec  Average Normal speed: 0.68 m/s Fast speed 1: 10.9sec Fast speed 2: 10.8 sec  Average Fast speed: 0.92 m/s Cut off scores: <0.4 m/s = household Ambulator, 0.4-0.8 m/s = limited community Ambulator, >0.8 m/s = community Ambulator, >1.2 m/s = crossing a street, <1.0 = increased fall risk MCID 0.05 m/s (small), 0.13 m/s (moderate), 0.06 m/s (significant)  (ANPTA Core Set of Outcome Measures for Adults with Neurologic Conditions, 2018)  Berg Balance Scale:    [facility] PT Assessment - 01/20/24 0001       Berg Balance Test   Sit to Stand Able to stand  independently using hands    Standing Unsupported Able to stand safely 2 minutes    Sitting with Back Unsupported but Feet Supported on Floor or Stool Able to sit safely and securely 2 minutes    Stand to Sit Sits safely with minimal use of hands    Transfers Able to transfer safely, definite  need of hands    Standing Unsupported with Eyes Closed Able to stand 10 seconds safely    Standing Unsupported with  Feet Together Able to place feet together independently and stand 1 minute safely    From Standing, Reach Forward with Outstretched Arm Can reach confidently >25 cm (10)    From Standing Position, Pick up Object from Floor Able to pick up shoe, needs supervision    From Standing Position, Turn to Look Behind Over each Shoulder Looks behind one side only/other side shows less weight shift    Turn 360 Degrees Able to turn 360 degrees safely in 4 seconds or less    Standing Unsupported, Alternately Place Feet on Step/Stool Able to stand independently and safely and complete 8 steps in 20 seconds    Standing Unsupported, One Foot in Front Able to plae foot ahead of the other independently and hold 30 seconds    Standing on One Leg Tries to lift leg/unable to hold 3 seconds but remains standing independently    Total Score 48          Functional gait assessment:   TBD Interpretation of scores: Non-Specific Older Adults Cutoff Score: <=22/30 = risk of falls Parkinsons Disease Cutoff score <15/30= fall risk (Hoehn & Yahr 1-4)  Minimally Clinically Important Difference (MCID)  Stroke (acute, subacute, and chronic) = MDC: 4.2 points Vestibular (acute) = MDC: 6 points Community Dwelling Older Adults =  MCID: 4 points Parkinsons Disease  =  MDC: 4.3 points  (Academy of Neurologic Physical Therapy (nd). Functional Gait Assessment. Retrieved from https://www.neuropt.org/docs/default-source/cpgs/core-outcome-measures/function-gait-assessment-pocket-guide-proof9-(2).pdf?sfvrsn=b61f35043_0.)  PATIENT SURVEYS:  Hx of memor                                                                                                                             TREATMENT DATE:  FGA ABC:   PATIENT EDUCATION: Education details: POC Person educated: Patient Education method: Explanation,  Demonstration, and Handouts Education comprehension: verbalized understanding and returned demonstration  HOME EXERCISE PROGRAM: Access Code: [email or web address] URL: [email or web address] Date: 01/20/2024 Prepared by: Massie Dollar  Exercises - Standing Tandem Balance with Counter Support  - 1 x daily - 7 x weekly - 3 sets - 6 reps - 20secnods  hold - Standing Single Leg Stance with Counter Support  - 1 x daily - 7 x weekly - 3 sets - 6 reps - 20 seconds  hold - Sit to Stand with Armchair  - 1 x daily - 7 x weekly - 3 sets - 10 reps - Standing Hip Abduction with Counter Support  - 1 x daily - 7 x weekly - 3 sets - 10 reps  GOALS: Goals reviewed with patient? Yes   SHORT TERM GOALS: Target date: 02/18/2024    Patient will be independent in home exercise program to improve strength/mobility for better functional independence with ADLs. Baseline:initiated on 1/13 Goal status: INITIAL   LONG TERM GOALS: Target date: 04/14/2024    Patient will increase ABC scale score >80% to demonstrate better functional mobility and better confidence with ADLs.  Baseline: to be completed  Goal status: INITIAL  2.  Patient (> 79 years old) will complete five times sit to stand test in < 15 seconds indicating an increased LE strength and improved balance. Baseline: 17.65 with BUE support; unable to stand without UE  Goal status: INITIAL  3.  Patient will increase Berg Balance score by > 6 points to demonstrate decreased fall risk during functional activities Baseline: 48 Goal status: INITIAL  4.  Patient will increase 10 meter walk test to >1.42m/s as to improve gait speed for better community ambulation and to reduce fall risk. Baseline: normal 0.68 m/s; fast 0.41m/s Goal status: INITIAL  5.  Patient will reduce timed up and go to <11 seconds to reduce fall risk and demonstrate improved transfer/gait ability. Baseline: 12.41sec  Goal status: INITIAL  6.  Patient will increaseFGA score  to >24 as to demonstrate reduced fall risk and improved dynamic gait balance for better safety with community/home ambulation.   Baseline: to be completed  Goal status: INITIAL   ASSESSMENT:  CLINICAL IMPRESSION: ***. Pt will benefit from skilled PT to address strength, balance and fall risk concerns to improve overall QoL and allow return to PLOF.     OBJECTIVE IMPAIRMENTS: Abnormal gait, decreased activity tolerance, decreased balance, decreased coordination, decreased endurance, decreased knowledge of condition, decreased mobility, difficulty walking, decreased strength, decreased safety awareness, dizziness, impaired flexibility, and improper body mechanics.   ACTIVITY LIMITATIONS: carrying, lifting, bending, standing, squatting, stairs, transfers, locomotion level, and caring for others  PARTICIPATION LIMITATIONS: meal prep, cleaning, laundry, driving, shopping, community activity, and yard work  PERSONAL FACTORS: Age, Fitness, Past/current experiences, Time since onset of injury/illness/exacerbation, and 1-2 comorbidities:   are also affecting patient's functional outcome.   REHAB POTENTIAL: Good  CLINICAL DECISION MAKING: Evolving/moderate complexity  EVALUATION COMPLEXITY: Moderate  PLAN:  PT FREQUENCY: 1-2x/week  PT DURATION: 12 weeks  PLANNED INTERVENTIONS: 97164- PT Re-evaluation, 97750- Physical Performance Testing, 97110-Therapeutic exercises, 97530- Therapeutic activity, [id number]- Neuromuscular re-education, 97535- Self Care, [id number]- Manual therapy, [id number]- Gait training, [phone]- Canalith repositioning, 79439 (1-2 muscles), [id number] (3+ muscles)- Dry Needling, Patient/Family education, Balance training, Stair training, Vestibular training, Visual/preceptual remediation/compensation, DME instructions, Moist heat, and Biofeedback  PLAN FOR NEXT SESSION:  Completed FGA and ABC Expand balance HEP.   Raydin Bielinski, PT 01/26/2024, 8:25 AM        "

## 2024-01-28 ENCOUNTER — Ambulatory Visit: Admitting: Physical Therapy

## 2024-01-28 ENCOUNTER — Telehealth: Payer: Self-pay | Admitting: Physical Therapy

## 2024-01-28 NOTE — Telephone Encounter (Signed)
 Pt contacted via telephone and author left voice mail informing of missed appointment and informed pt that we will schedule one at a time if he would like to continue per our attendance policy. Requested a call back at [phone] for any scheduling of future appointments.   Lonni Gainer PT, DPT

## 2024-01-28 NOTE — Therapy (Incomplete)
 " OUTPATIENT PHYSICAL THERAPY NEURO TREATMENT   Patient Name: Jeremiah Price MRN: [id number] DOB:[birth year], 79 y.o., male Today's Date: 01/28/2024   PCP: Kristina Tinnie POUR, PA-C  REFERRING PROVIDER:  Lane Arthea BRAVO, MD      END OF SESSION:    Past Medical History:  Diagnosis Date   Hypertension    Hypothyroidism    Vertigo    Past Surgical History:  Procedure Laterality Date   CHOLECYSTECTOMY     Patient Active Problem List   Diagnosis Date Noted   Dysuria 12/26/2019   Encounter for general adult medical examination with abnormal findings 12/15/2018   Ventral hernia without obstruction or gangrene 12/15/2018   Need for vaccination against Streptococcus pneumoniae using pneumococcal conjugate vaccine 13 12/15/2018   Screening for colon cancer 01/14/2018   Elevated LFTs 03/03/2016   E coli bacteremia 03/01/2016   Abdominal pain 02/17/2016   Transaminitis 02/17/2016   Hypothyroidism 02/17/2016   Essential hypertension 02/17/2016   Sepsis (HCC) 02/17/2016    ONSET DATE: 6 months- 1 year  REFERRING DIAG:  Diagnosis  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  R26.81 (ICD-10-CM) - Unsteady gait when walking  R29.6 (ICD-10-CM) - Multiple falls  R26.89 (ICD-10-CM) - Impairment of balance    THERAPY DIAG:  Imbalance  Abnormality of gait  Muscle weakness (generalized)  Rationale for Evaluation and Treatment: Rehabilitation  SUBJECTIVE:                                                                                                                                                                                             SUBJECTIVE STATEMENT: *** Pt accompanied by: self  PERTINENT HISTORY:   Pertinent hx of HTN, Hypothyroidism, Vertigo. Pt has had recent hospitalization after being found on floor by wife. States that he had been drinking and laid down on the floor so that he would not fall, and then fell asleep. No  significant issues noted upon examination at ED.   PAIN:  Are you having pain? No  PRECAUTIONS: Fall  RED FLAGS: None   WEIGHT BEARING RESTRICTIONS: No  FALLS: Has patient fallen in last 6 months? Yes. Number of falls 4  LIVING ENVIRONMENT: Lives with: lives with their spouse Lives in: House/apartment Stairs: Yes: External: 1 steps; on right going up Has following equipment at home: None  PLOF: Independent, Independent with basic ADLs, Independent with household mobility without device, Independent with community mobility without device, Independent with gait, and Independent with transfers  PATIENT GOALS: improve safety with walking.   OBJECTIVE:  Note: Objective measures were completed at Evaluation unless otherwise noted.  DIAGNOSTIC FINDINGS:   CT 12/2023 IMPRESSION: 1. No acute intracranial abnormality. 2. 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  CT 10/2023  IMPRESSION: 1. No acute intracranial abnormality no mass. 2. Slight interval enlargement of small bifrontal subdural hygromas. Only mild associated mass effect.  COGNITION: Overall cognitive status: History of cognitive impairments - at baseline   SENSATION: WFL  COORDINATION: Finger to nose WFL Ankle to knee WFL   POSTURE: rounded shoulders and forward head  LOWER EXTREMITY ROM:     WFL for all tested.   LOWER EXTREMITY MMT:    MMT Right Eval Left Eval  Hip flexion 4 4  Hip extension 4 4  Hip abduction 4 4  Hip adduction 5 5  Hip internal rotation    Hip external rotation    Knee flexion 4+ 4+  Knee extension 5 5  Ankle dorsiflexion 5 4+  Ankle plantarflexion    Ankle inversion    Ankle eversion    (Blank rows = not tested)  BED MOBILITY:  Not tested  TRANSFERS: Sit to stand: Modified independence  Assistive device utilized: arm rest     Stand to sit: Modified independence  Assistive device  utilized: arm rest      Chair to chair: Modified independence  Assistive device utilized: arm rest        RAMP:  Not tested  CURB:  Findings: UE supported on rail   STAIRS: Findings: Level of Assistance: Modified independence, Stair Negotiation Technique: Step to Pattern Alternating Pattern  with Bilateral Rails, Number of Stairs: 4, Height of Stairs: 6   , and Comments: step to on descent  GAIT: Findings: Gait Characteristics: step through pattern, trendelenburg, lateral hip instability, and narrow BOS, Distance walked: 60, Assistive device utilized:None, Level of assistance: Complete Independence, and Comments: mild R side trendelenburg. And decreased step length on the R side    FUNCTIONAL TESTS:  5 times sit to stand: 17.65 with BUE support  Timed up and go (TUG): 12.41 6 minute walk test: to be completed   10 Meter Walk Test: Patient instructed to walk 10 meters (32.8 ft) as quickly and as safely as possible at their normal speed x2 and at a fast speed x2. Time measured from 2 meter mark to 8 meter mark to accommodate ramp-up and ramp-down.  Normal speed 1: 14.9 sec  Normal speed 2: 14.5 sec  Average Normal speed: 0.68 m/s Fast speed 1: 10.9sec Fast speed 2: 10.8 sec  Average Fast speed: 0.92 m/s Cut off scores: <0.4 m/s = household Ambulator, 0.4-0.8 m/s = limited community Ambulator, >0.8 m/s = community Ambulator, >1.2 m/s = crossing a street, <1.0 = increased fall risk MCID 0.05 m/s (small), 0.13 m/s (moderate), 0.06 m/s (significant)  (ANPTA Core Set of Outcome Measures for Adults with Neurologic Conditions, 2018)  Berg Balance Scale:    [facility] PT Assessment - 01/20/24 0001       Berg Balance Test   Sit to Stand Able to stand  independently using hands    Standing Unsupported Able to stand safely 2 minutes    Sitting with Back Unsupported but Feet Supported on Floor or Stool Able to sit safely and securely 2 minutes    Stand to Sit Sits safely with minimal use of  hands    Transfers Able to transfer safely, definite need of hands    Standing Unsupported with Eyes Closed Able to stand 10 seconds safely  Standing Unsupported with Feet Together Able to place feet together independently and stand 1 minute safely    From Standing, Reach Forward with Outstretched Arm Can reach confidently >25 cm (10)    From Standing Position, Pick up Object from Floor Able to pick up shoe, needs supervision    From Standing Position, Turn to Look Behind Over each Shoulder Looks behind one side only/other side shows less weight shift    Turn 360 Degrees Able to turn 360 degrees safely in 4 seconds or less    Standing Unsupported, Alternately Place Feet on Step/Stool Able to stand independently and safely and complete 8 steps in 20 seconds    Standing Unsupported, One Foot in Front Able to plae foot ahead of the other independently and hold 30 seconds    Standing on One Leg Tries to lift leg/unable to hold 3 seconds but remains standing independently    Total Score 48          Functional gait assessment:   TBD Interpretation of scores: Non-Specific Older Adults Cutoff Score: <=22/30 = risk of falls Parkinsons Disease Cutoff score <15/30= fall risk (Hoehn & Yahr 1-4)  Minimally Clinically Important Difference (MCID)  Stroke (acute, subacute, and chronic) = MDC: 4.2 points Vestibular (acute) = MDC: 6 points Community Dwelling Older Adults =  MCID: 4 points Parkinsons Disease  =  MDC: 4.3 points  (Academy of Neurologic Physical Therapy (nd). Functional Gait Assessment. Retrieved from https://www.neuropt.org/docs/default-source/cpgs/core-outcome-measures/function-gait-assessment-pocket-guide-proof9-(2).pdf?sfvrsn=b72f35043_0.)  PATIENT SURVEYS:  Hx of memor                                                                                                                             TREATMENT DATE:  Pt participated in Functional Gait Assessment (FGA) with score of  ***/30 demonstrating *** fall risk (low fall risk 25-28, medium fall risk 19-24, and high fall risk <19).  ABC: ***  Expand balance HEP. ***  PATIENT EDUCATION: Education details: POC Person educated: Patient Education method: Explanation, Demonstration, and Handouts Education comprehension: verbalized understanding and returned demonstration  HOME EXERCISE PROGRAM: Access Code: [email or web address] URL: [email or web address] Date: 01/20/2024 Prepared by: Massie Dollar  Exercises - Standing Tandem Balance with Counter Support  - 1 x daily - 7 x weekly - 3 sets - 6 reps - 20secnods  hold - Standing Single Leg Stance with Counter Support  - 1 x daily - 7 x weekly - 3 sets - 6 reps - 20 seconds  hold - Sit to Stand with Armchair  - 1 x daily - 7 x weekly - 3 sets - 10 reps - Standing Hip Abduction with Counter Support  - 1 x daily - 7 x weekly - 3 sets - 10 reps  GOALS: Goals reviewed with patient? Yes   SHORT TERM GOALS: Target date: 02/18/2024    Patient will be independent in home exercise program to improve strength/mobility for better functional independence with ADLs. Baseline:initiated on 1/13 Goal status: INITIAL  LONG TERM GOALS: Target date: 04/14/2024    Patient will increase ABC scale score >80% to demonstrate better functional mobility and better confidence with ADLs.  Baseline: to be completed  Goal status: INITIAL  2.  Patient (> 51 years old) will complete five times sit to stand test in < 15 seconds indicating an increased LE strength and improved balance. Baseline: 17.65 with BUE support; unable to stand without UE  Goal status: INITIAL  3.  Patient will increase Berg Balance score by > 6 points to demonstrate decreased fall risk during functional activities Baseline: 48 Goal status: INITIAL  4.  Patient will increase 10 meter walk test to >1.45m/s as to improve gait speed for better community ambulation and to reduce fall risk. Baseline: normal  0.68 m/s; fast 0.15m/s Goal status: INITIAL  5.  Patient will reduce timed up and go to <11 seconds to reduce fall risk and demonstrate improved transfer/gait ability. Baseline: 12.41sec  Goal status: INITIAL  6.  Patient will increaseFGA score to >24 as to demonstrate reduced fall risk and improved dynamic gait balance for better safety with community/home ambulation.   Baseline: to be completed  Goal status: INITIAL   ASSESSMENT:  CLINICAL IMPRESSION: ***. Pt will benefit from skilled PT to address strength, balance and fall risk concerns to improve overall QoL and allow return to PLOF.     OBJECTIVE IMPAIRMENTS: Abnormal gait, decreased activity tolerance, decreased balance, decreased coordination, decreased endurance, decreased knowledge of condition, decreased mobility, difficulty walking, decreased strength, decreased safety awareness, dizziness, impaired flexibility, and improper body mechanics.   ACTIVITY LIMITATIONS: carrying, lifting, bending, standing, squatting, stairs, transfers, locomotion level, and caring for others  PARTICIPATION LIMITATIONS: meal prep, cleaning, laundry, driving, shopping, community activity, and yard work  PERSONAL FACTORS: Age, Fitness, Past/current experiences, Time since onset of injury/illness/exacerbation, and 1-2 comorbidities:   are also affecting patient's functional outcome.   REHAB POTENTIAL: Good  CLINICAL DECISION MAKING: Evolving/moderate complexity  EVALUATION COMPLEXITY: Moderate  PLAN:  PT FREQUENCY: 1-2x/week  PT DURATION: 12 weeks  PLANNED INTERVENTIONS: 97164- PT Re-evaluation, 97750- Physical Performance Testing, 97110-Therapeutic exercises, 97530- Therapeutic activity, [id number]- Neuromuscular re-education, 97535- Self Care, 02859- Manual therapy, [id number]- Gait training, [phone]- Canalith repositioning, 79439 (1-2 muscles), 20561 (3+ muscles)- Dry Needling, Patient/Family education, Balance training, Stair training, Vestibular  training, Visual/preceptual remediation/compensation, DME instructions, Moist heat, and Biofeedback  PLAN FOR NEXT SESSION:  Completed FGA and ABC Expand balance HEP.   Lonni KATHEE Gainer, PT 01/28/2024, 8:22 AM        "

## 2024-01-28 NOTE — Therapy (Deleted)
 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 12/15/2018   Need for vaccination against Streptococcus pneumoniae using pneumococcal conjugate vaccine 13 12/15/2018   Screening for colon cancer 01/14/2018   Elevated LFTs 03/03/2016   E coli bacteremia 03/01/2016   Abdominal pain 02/17/2016   Transaminitis 02/17/2016   Hypothyroidism 02/17/2016   Essential hypertension 02/17/2016   Sepsis (HCC) 02/17/2016    ONSET DATE: 6 months- 1 year  REFERRING DIAG:  Diagnosis  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  R26.81 (ICD-10-CM) - Unsteady gait when walking  R29.6 (ICD-10-CM) - Multiple falls  R26.89 (ICD-10-CM) - Impairment of balance    THERAPY DIAG:  Imbalance  Abnormality of gait  Muscle weakness (generalized)  Rationale for Evaluation and Treatment: Rehabilitation  SUBJECTIVE:                                                                                                                                                                                             SUBJECTIVE STATEMENT: States that over the 6 months to a year, he has noticed more unsteadies with his walking, reports that it is the worst when he first stands up after sitting for a little while. Also states that he feels a little lightheaded when he stands up for the first time after prolonged  sitting. Also  states that his Left had is not as coordinated as it used to be.  Pt accompanied by: self  PERTINENT HISTORY:   Pertinent hx of HTN, Hypothyroidism, Vertigo. Pt has had recent hospitalization after being found on floor by wife. States that he had been drinking and laid down on the floor so that he would not fall, and then fell asleep. No significant issues noted upon examination at ED.   PAIN:  Are you having pain? No  PRECAUTIONS: Fall  RED FLAGS: None   WEIGHT BEARING RESTRICTIONS: No  FALLS: Has patient fallen in last 6 months? Yes. Number of falls 4  LIVING ENVIRONMENT: Lives with: lives  with their spouse Lives in: House/apartment Stairs: Yes: External: 1 steps; on right going up Has following equipment at home: None  PLOF: Independent, Independent with basic ADLs, Independent with household mobility without device, Independent with community mobility without device, Independent with gait, and Independent with transfers  PATIENT GOALS: improve safety with walking.   OBJECTIVE:  Note: Objective measures were completed at Evaluation unless otherwise noted.  DIAGNOSTIC FINDINGS:   CT 12/2023 IMPRESSION: 1. No acute intracranial abnormality. 2. 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  CT 10/2023  IMPRESSION: 1. No acute intracranial abnormality no mass. 2. Slight interval enlargement of small bifrontal subdural hygromas. Only mild associated mass effect.  COGNITION: Overall cognitive status: History of cognitive impairments - at baseline   SENSATION: WFL  COORDINATION: Finger to nose WFL Ankle to knee WFL   POSTURE: rounded shoulders and forward head  LOWER EXTREMITY ROM:     WFL for all tested.   LOWER EXTREMITY MMT:    MMT Right Eval Left Eval  Hip flexion 4 4  Hip extension 4 4  Hip abduction 4 4  Hip adduction 5 5  Hip internal rotation    Hip external rotation     Knee flexion 4+ 4+  Knee extension 5 5  Ankle dorsiflexion 5 4+  Ankle plantarflexion    Ankle inversion    Ankle eversion    (Blank rows = not tested)  BED MOBILITY:  Not tested  TRANSFERS: Sit to stand: Modified independence  Assistive device utilized: arm rest     Stand to sit: Modified independence  Assistive device utilized: arm rest      Chair to chair: Modified independence  Assistive device utilized: arm rest        RAMP:  Not tested  CURB:  Findings: UE supported on rail   STAIRS: Findings: Level of Assistance: Modified independence, Stair Negotiation Technique: Step to Pattern Alternating Pattern  with Bilateral Rails, Number of Stairs: 4, Height of Stairs: 6   , and Comments: step to on descent  GAIT: Findings: Gait Characteristics: step through pattern, trendelenburg, lateral hip instability, and narrow BOS, Distance walked: 60, Assistive device utilized:None, Level of assistance: Complete Independence, and Comments: mild R side trendelenburg. And decreased step length on the R side    FUNCTIONAL TESTS:  5 times sit to stand: 17.65 with BUE support  Timed up and go (TUG): 12.41 6 minute walk test: to be completed   10 Meter Walk Test: Patient instructed to walk 10 meters (32.8 ft) as quickly and as safely as possible at their normal speed x2 and at a fast speed x2. Time measured from 2 meter mark to 8 meter mark to accommodate ramp-up and ramp-down.  Normal speed 1: 14.9 sec  Normal speed 2: 14.5 sec  Average Normal speed: 0.68 m/s Fast speed 1: 10.9sec Fast speed 2: 10.8 sec  Average Fast speed: 0.92 m/s Cut off scores: <0.4 m/s = household Ambulator, 0.4-0.8 m/s = limited community Ambulator, >0.8 m/s = community Ambulator, >1.2 m/s = crossing a street, <1.0 = increased fall risk MCID 0.05 m/s (small), 0.13 m/s (moderate), 0.06 m/s (significant)  (ANPTA Core Set of Outcome Measures for Adults with Neurologic Conditions, 2018)  Berg Balance Scale:     [facility] PT Assessment - 01/20/24 0001       Berg Balance Test   Sit to Stand Able to stand  independently using hands  Standing Unsupported Able to stand safely 2 minutes    Sitting with Back Unsupported but Feet Supported on Floor or Stool Able to sit safely and securely 2 minutes    Stand to Sit Sits safely with minimal use of hands    Transfers Able to transfer safely, definite need of hands    Standing Unsupported with Eyes Closed Able to stand 10 seconds safely    Standing Unsupported with Feet Together Able to place feet together independently and stand 1 minute safely    From Standing, Reach Forward with Outstretched Arm Can reach confidently >25 cm (10)    From Standing Position, Pick up Object from Floor Able to pick up shoe, needs supervision    From Standing Position, Turn to Look Behind Over each Shoulder Looks behind one side only/other side shows less weight shift    Turn 360 Degrees Able to turn 360 degrees safely in 4 seconds or less    Standing Unsupported, Alternately Place Feet on Step/Stool Able to stand independently and safely and complete 8 steps in 20 seconds    Standing Unsupported, One Foot in Front Able to plae foot ahead of the other independently and hold 30 seconds    Standing on One Leg Tries to lift leg/unable to hold 3 seconds but remains standing independently    Total Score 48          Functional gait assessment:   TBD Interpretation of scores: Non-Specific Older Adults Cutoff Score: <=22/30 = risk of falls Parkinsons Disease Cutoff score <15/30= fall risk (Hoehn & Yahr 1-4)  Minimally Clinically Important Difference (MCID)  Stroke (acute, subacute, and chronic) = MDC: 4.2 points Vestibular (acute) = MDC: 6 points Community Dwelling Older Adults =  MCID: 4 points Parkinsons Disease  =  MDC: 4.3 points  (Academy of Neurologic Physical Therapy (nd). Functional Gait Assessment. Retrieved from  https://www.neuropt.org/docs/default-source/cpgs/core-outcome-measures/function-gait-assessment-pocket-guide-proof9-(2).pdf?sfvrsn=b79f35043_0.)  PATIENT SURVEYS:  Hx of memor                                                                                                                             TREATMENT DATE:  PT Evaluation  VS assessment  Sitting 146/94 HR 62  Standing: 140/98 HR 70 no S/s  HEP initiated see below.   PATIENT EDUCATION: Education details: POC Person educated: Patient Education method: Explanation, Facilities Manager, and Handouts Education comprehension: verbalized understanding and returned demonstration  HOME EXERCISE PROGRAM: Access Code: [email or web address] URL: [email or web address] Date: 01/20/2024 Prepared by: Massie Dollar  Exercises - Standing Tandem Balance with Counter Support  - 1 x daily - 7 x weekly - 3 sets - 6 reps - 20secnods  hold - Standing Single Leg Stance with Counter Support  - 1 x daily - 7 x weekly - 3 sets - 6 reps - 20 seconds  hold - Sit to Stand with Armchair  - 1 x daily - 7 x weekly - 3 sets - 10 reps - Standing Hip Abduction with  Counter Support  - 1 x daily - 7 x weekly - 3 sets - 10 reps  GOALS: Goals reviewed with patient? Yes   SHORT TERM GOALS: Target date: 02/18/2024    Patient will be independent in home exercise program to improve strength/mobility for better functional independence with ADLs. Baseline:initiated on 1/13 Goal status: INITIAL   LONG TERM GOALS: Target date: 04/14/2024    Patient will increase ABC scale score >80% to demonstrate better functional mobility and better confidence with ADLs.  Baseline: to be completed  Goal status: INITIAL  2.  Patient (> 47 years old) will complete five times sit to stand test in < 15 seconds indicating an increased LE strength and improved balance. Baseline: 17.65 with BUE support; unable to stand without UE  Goal status: INITIAL  3.  Patient will increase Berg  Balance score by > 6 points to demonstrate decreased fall risk during functional activities Baseline: 48 Goal status: INITIAL  4.  Patient will increase 10 meter walk test to >1.51m/s as to improve gait speed for better community ambulation and to reduce fall risk. Baseline: normal 0.68 m/s; fast 0.74m/s Goal status: INITIAL  5.  Patient will reduce timed up and go to <11 seconds to reduce fall risk and demonstrate improved transfer/gait ability. Baseline: 12.41sec  Goal status: INITIAL  6.  Patient will increaseFGA score to >24 as to demonstrate reduced fall risk and improved dynamic gait balance for better safety with community/home ambulation.   Baseline: to be completed  Goal status: INITIAL   ASSESSMENT:  CLINICAL IMPRESSION: Patient is a 79 y.o. male who was seen today for physical therapy evaluation and treatment for imbalance and recent falls. Pt reports that he has been feeling more unsteady with walking over the last year. Noted to have increased fall risk with normal gait speed <0.57m/s, berg 48/56, TUG >11 sec, and 5x STS >15 sec. Strengthen and power deficits also noted with inability to stand without use of UE support from standard seat height. Pt will benefit from skilled PT to address strength, balance and fall risk concerns to improve overall QoL and allow return to PLOF.     OBJECTIVE IMPAIRMENTS: Abnormal gait, decreased activity tolerance, decreased balance, decreased coordination, decreased endurance, decreased knowledge of condition, decreased mobility, difficulty walking, decreased strength, decreased safety awareness, dizziness, impaired flexibility, and improper body mechanics.   ACTIVITY LIMITATIONS: carrying, lifting, bending, standing, squatting, stairs, transfers, locomotion level, and caring for others  PARTICIPATION LIMITATIONS: meal prep, cleaning, laundry, driving, shopping, community activity, and yard work  PERSONAL FACTORS: Age, Fitness, Past/current  experiences, Time since onset of injury/illness/exacerbation, and 1-2 comorbidities:   are also affecting patient's functional outcome.   REHAB POTENTIAL: Good  CLINICAL DECISION MAKING: Evolving/moderate complexity  EVALUATION COMPLEXITY: Moderate  PLAN:  PT FREQUENCY: 1-2x/week  PT DURATION: 12 weeks  PLANNED INTERVENTIONS: 97164- PT Re-evaluation, 97750- Physical Performance Testing, 97110-Therapeutic exercises, 97530- Therapeutic activity, [id number]- Neuromuscular re-education, 97535- Self Care, [id number]- Manual therapy, [id number]- Gait training, [phone]- Canalith repositioning, [id number] (1-2 muscles), [id number] (3+ muscles)- Dry Needling, Patient/Family education, Balance training, Stair training, Vestibular training, Visual/preceptual remediation/compensation, DME instructions, Moist heat, and Biofeedback  PLAN FOR NEXT SESSION:  Completed FGA and ABC Expand balance HEP.   Lonni KATHEE Gainer, PT 01/28/2024, 8:21 AM        "

## 2024-02-02 ENCOUNTER — Ambulatory Visit

## 2024-02-03 ENCOUNTER — Other Ambulatory Visit: Payer: Self-pay | Admitting: Physician Assistant

## 2024-02-03 DIAGNOSIS — I700 Atherosclerosis of aorta: Secondary | ICD-10-CM

## 2024-02-03 NOTE — Telephone Encounter (Signed)
Pt need appt for refills  ?

## 2024-02-05 ENCOUNTER — Ambulatory Visit

## 2024-02-09 ENCOUNTER — Ambulatory Visit

## 2024-02-12 ENCOUNTER — Ambulatory Visit

## 2024-02-16 ENCOUNTER — Ambulatory Visit

## 2024-02-19 ENCOUNTER — Ambulatory Visit

## 2024-02-23 ENCOUNTER — Ambulatory Visit

## 2024-02-26 ENCOUNTER — Ambulatory Visit

## 2024-03-01 ENCOUNTER — Ambulatory Visit

## 2024-03-04 ENCOUNTER — Ambulatory Visit

## 2024-03-08 ENCOUNTER — Ambulatory Visit

## 2024-03-11 ENCOUNTER — Ambulatory Visit

## 2024-03-15 ENCOUNTER — Ambulatory Visit

## 2024-03-18 ENCOUNTER — Ambulatory Visit

## 2024-03-22 ENCOUNTER — Ambulatory Visit

## 2024-03-25 ENCOUNTER — Ambulatory Visit

## 2024-03-25 ENCOUNTER — Ambulatory Visit: Admitting: Physician Assistant

## 2024-03-29 ENCOUNTER — Ambulatory Visit

## 2024-04-01 ENCOUNTER — Ambulatory Visit

## 2024-04-05 ENCOUNTER — Ambulatory Visit

## 2024-04-08 ENCOUNTER — Ambulatory Visit

## 2024-04-12 ENCOUNTER — Ambulatory Visit

## 2024-04-19 ENCOUNTER — Ambulatory Visit

## 2024-04-26 ENCOUNTER — Ambulatory Visit

## 2024-05-03 ENCOUNTER — Ambulatory Visit

## 2024-05-10 ENCOUNTER — Ambulatory Visit

## 2024-05-17 ENCOUNTER — Ambulatory Visit

## 2024-09-02 ENCOUNTER — Ambulatory Visit: Admitting: Physician Assistant
